# Patient Record
Sex: Male | Born: 1955 | Race: White | Hispanic: No | Smoking: Former smoker
Health system: Southern US, Community
[De-identification: ages and names within clinical notes are randomized; demographics above are authoritative.]

## PROBLEM LIST (undated history)

## (undated) ENCOUNTER — Emergency Department (HOSPITAL_COMMUNITY): Payer: Medicare PPO | Source: Home / Self Care

## (undated) DIAGNOSIS — I10 Essential (primary) hypertension: Secondary | ICD-10-CM

## (undated) HISTORY — PX: APPENDECTOMY: SHX54

## (undated) HISTORY — PX: OTHER SURGICAL HISTORY: SHX169

## (undated) HISTORY — PX: EYE SURGERY: SHX253

## (undated) HISTORY — PX: KNEE SURGERY: SHX244

---

## 2000-12-15 ENCOUNTER — Ambulatory Visit (HOSPITAL_COMMUNITY): Admission: RE | Admit: 2000-12-15 | Discharge: 2000-12-15 | Payer: Self-pay | Admitting: *Deleted

## 2001-02-19 ENCOUNTER — Ambulatory Visit (HOSPITAL_COMMUNITY): Admission: RE | Admit: 2001-02-19 | Discharge: 2001-02-19 | Payer: Self-pay | Admitting: *Deleted

## 2001-03-07 ENCOUNTER — Encounter: Payer: Self-pay | Admitting: Emergency Medicine

## 2001-03-07 ENCOUNTER — Emergency Department (HOSPITAL_COMMUNITY): Admission: EM | Admit: 2001-03-07 | Discharge: 2001-03-07 | Payer: Self-pay | Admitting: Emergency Medicine

## 2001-06-15 ENCOUNTER — Observation Stay (HOSPITAL_COMMUNITY): Admission: EM | Admit: 2001-06-15 | Discharge: 2001-06-16 | Payer: Self-pay | Admitting: *Deleted

## 2001-06-15 ENCOUNTER — Encounter: Payer: Self-pay | Admitting: *Deleted

## 2001-08-28 ENCOUNTER — Encounter: Payer: Self-pay | Admitting: *Deleted

## 2001-08-28 ENCOUNTER — Emergency Department (HOSPITAL_COMMUNITY): Admission: EM | Admit: 2001-08-28 | Discharge: 2001-08-28 | Payer: Self-pay | Admitting: *Deleted

## 2002-02-17 ENCOUNTER — Encounter: Payer: Self-pay | Admitting: Gastroenterology

## 2002-02-17 ENCOUNTER — Encounter: Admission: RE | Admit: 2002-02-17 | Discharge: 2002-02-17 | Payer: Self-pay | Admitting: Gastroenterology

## 2002-03-01 ENCOUNTER — Encounter: Payer: Self-pay | Admitting: Gastroenterology

## 2002-03-01 ENCOUNTER — Encounter: Admission: RE | Admit: 2002-03-01 | Discharge: 2002-03-01 | Payer: Self-pay | Admitting: Gastroenterology

## 2003-10-23 ENCOUNTER — Emergency Department (HOSPITAL_COMMUNITY): Admission: EM | Admit: 2003-10-23 | Discharge: 2003-10-24 | Payer: Self-pay | Admitting: *Deleted

## 2005-11-21 ENCOUNTER — Emergency Department (HOSPITAL_COMMUNITY): Admission: EM | Admit: 2005-11-21 | Discharge: 2005-11-21 | Payer: Self-pay | Admitting: Emergency Medicine

## 2005-11-28 ENCOUNTER — Emergency Department (HOSPITAL_COMMUNITY): Admission: EM | Admit: 2005-11-28 | Discharge: 2005-11-28 | Payer: Self-pay | Admitting: Emergency Medicine

## 2007-01-26 ENCOUNTER — Ambulatory Visit (HOSPITAL_COMMUNITY): Admission: RE | Admit: 2007-01-26 | Discharge: 2007-01-26 | Payer: Self-pay | Admitting: Family Medicine

## 2007-03-29 ENCOUNTER — Ambulatory Visit: Payer: Self-pay | Admitting: Orthopedic Surgery

## 2007-03-31 ENCOUNTER — Encounter: Payer: Self-pay | Admitting: Orthopedic Surgery

## 2007-04-20 ENCOUNTER — Emergency Department (HOSPITAL_COMMUNITY): Admission: EM | Admit: 2007-04-20 | Discharge: 2007-04-20 | Payer: Self-pay | Admitting: Emergency Medicine

## 2007-11-08 ENCOUNTER — Emergency Department (HOSPITAL_COMMUNITY): Admission: EM | Admit: 2007-11-08 | Discharge: 2007-11-08 | Payer: Self-pay | Admitting: Emergency Medicine

## 2008-04-03 ENCOUNTER — Emergency Department (HOSPITAL_COMMUNITY): Admission: EM | Admit: 2008-04-03 | Discharge: 2008-04-03 | Payer: Self-pay | Admitting: Emergency Medicine

## 2008-07-29 ENCOUNTER — Emergency Department (HOSPITAL_COMMUNITY): Admission: EM | Admit: 2008-07-29 | Discharge: 2008-07-29 | Payer: Self-pay | Admitting: Emergency Medicine

## 2008-08-15 ENCOUNTER — Encounter (INDEPENDENT_AMBULATORY_CARE_PROVIDER_SITE_OTHER): Payer: Self-pay | Admitting: General Surgery

## 2008-08-15 ENCOUNTER — Ambulatory Visit (HOSPITAL_COMMUNITY): Admission: AD | Admit: 2008-08-15 | Discharge: 2008-08-15 | Payer: Self-pay | Admitting: General Surgery

## 2008-10-02 ENCOUNTER — Ambulatory Visit (HOSPITAL_COMMUNITY): Admission: RE | Admit: 2008-10-02 | Discharge: 2008-10-02 | Payer: Self-pay | Admitting: Family Medicine

## 2009-07-10 ENCOUNTER — Ambulatory Visit (HOSPITAL_COMMUNITY): Admission: RE | Admit: 2009-07-10 | Discharge: 2009-07-10 | Payer: Self-pay | Admitting: Urology

## 2010-07-29 ENCOUNTER — Inpatient Hospital Stay (HOSPITAL_COMMUNITY)
Admission: RE | Admit: 2010-07-29 | Discharge: 2010-08-01 | Payer: Self-pay | Source: Home / Self Care | Attending: Orthopedic Surgery | Admitting: Orthopedic Surgery

## 2010-07-29 LAB — SURGICAL PCR SCREEN
MRSA, PCR: NEGATIVE
Staphylococcus aureus: NEGATIVE

## 2010-07-29 LAB — COMPREHENSIVE METABOLIC PANEL
ALT: 25 U/L (ref 0–53)
AST: 23 U/L (ref 0–37)
Albumin: 4.5 g/dL (ref 3.5–5.2)
Alkaline Phosphatase: 45 U/L (ref 39–117)
BUN: 15 mg/dL (ref 6–23)
CO2: 27 mEq/L (ref 19–32)
Calcium: 9.8 mg/dL (ref 8.4–10.5)
Chloride: 100 mEq/L (ref 96–112)
Creatinine, Ser: 0.77 mg/dL (ref 0.4–1.5)
GFR calc Af Amer: >60 mL/min (ref >60)
GFR calc non Af Amer: >60 mL/min (ref >60)
Glucose, Bld: 122 mg/dL — ABNORMAL HIGH (ref 70–99)
Potassium: 4 mEq/L (ref 3.5–5.1)
Sodium: 136 mEq/L (ref 135–145)
Total Bilirubin: 0.7 mg/dL (ref 0.3–1.2)
Total Protein: 7.4 g/dL (ref 6.0–8.3)

## 2010-07-29 LAB — URINALYSIS, ROUTINE W REFLEX MICROSCOPIC
Bilirubin Urine: NEGATIVE
Hgb urine dipstick: NEGATIVE
Ketones, ur: NEGATIVE mg/dL
Nitrite: NEGATIVE
Protein, ur: NEGATIVE mg/dL
Specific Gravity, Urine: 1.012 (ref 1.005–1.030)
Urine Glucose, Fasting: NEGATIVE mg/dL
Urobilinogen, UA: 0.2 mg/dL (ref 0.0–1.0)
pH: 6 (ref 5.0–8.0)

## 2010-07-29 LAB — CBC
HCT: 42 % (ref 39.0–52.0)
Hemoglobin: 14.4 g/dL (ref 13.0–17.0)
MCH: 31.4 pg (ref 26.0–34.0)
MCHC: 34.3 g/dL (ref 30.0–36.0)
MCV: 91.5 fL (ref 78.0–100.0)
Platelets: 273 10*3/uL (ref 150–400)
RBC: 4.59 MIL/uL (ref 4.22–5.81)
RDW: 12.6 % (ref 11.5–15.5)
WBC: 5.6 10*3/uL (ref 4.0–10.5)

## 2010-07-29 LAB — PROTIME-INR
INR: 0.99 (ref 0.00–1.49)
Prothrombin Time: 13.3 seconds (ref 11.6–15.2)

## 2010-07-29 LAB — APTT: aPTT: 29 seconds (ref 24–37)

## 2010-07-31 LAB — CBC
HCT: 31.3 % — ABNORMAL LOW (ref 39.0–52.0)
Hemoglobin: 10.4 g/dL — ABNORMAL LOW (ref 13.0–17.0)
MCH: 30.9 pg (ref 26.0–34.0)
MCHC: 33.2 g/dL (ref 30.0–36.0)
MCV: 92.9 fL (ref 78.0–100.0)
Platelets: 248 10*3/uL (ref 150–400)
RBC: 3.37 MIL/uL — ABNORMAL LOW (ref 4.22–5.81)
RDW: 12.8 % (ref 11.5–15.5)
WBC: 9 10*3/uL (ref 4.0–10.5)

## 2010-07-31 LAB — BASIC METABOLIC PANEL
BUN: 13 mg/dL (ref 6–23)
CO2: 28 mEq/L (ref 19–32)
Calcium: 8.5 mg/dL (ref 8.4–10.5)
Chloride: 102 mEq/L (ref 96–112)
Creatinine, Ser: 0.66 mg/dL (ref 0.4–1.5)
GFR calc Af Amer: >60 mL/min (ref >60)
GFR calc non Af Amer: >60 mL/min (ref >60)
Glucose, Bld: 127 mg/dL — ABNORMAL HIGH (ref 70–99)
Potassium: 4.2 mEq/L (ref 3.5–5.1)
Sodium: 135 mEq/L (ref 135–145)

## 2010-07-31 LAB — TYPE AND SCREEN
ABO/RH(D): A POS
Antibody Screen: NEGATIVE

## 2010-07-31 LAB — PROTIME-INR
INR: 1.05 (ref 0.00–1.49)
Prothrombin Time: 13.9 seconds (ref 11.6–15.2)

## 2010-07-31 LAB — ABO/RH: ABO/RH(D): A POS

## 2010-08-05 LAB — CBC
HCT: 29.5 % — ABNORMAL LOW (ref 39.0–52.0)
Hemoglobin: 9.9 g/dL — ABNORMAL LOW (ref 13.0–17.0)
MCH: 31 pg (ref 26.0–34.0)
MCH: 30.6 pg (ref 26.0–34.0)
MCHC: 33.6 g/dL (ref 30.0–36.0)
MCHC: 33.5 g/dL (ref 30.0–36.0)
MCV: 92.5 fL (ref 78.0–100.0)
Platelets: 235 10*3/uL (ref 150–400)
Platelets: 257 10*3/uL (ref 150–400)
RBC: 3.19 MIL/uL — ABNORMAL LOW (ref 4.22–5.81)
RBC: 3.04 MIL/uL — ABNORMAL LOW (ref 4.22–5.81)
RDW: 12.7 % (ref 11.5–15.5)
WBC: 8.8 10*3/uL (ref 4.0–10.5)

## 2010-08-05 LAB — BASIC METABOLIC PANEL
BUN: 11 mg/dL (ref 6–23)
CO2: 29 mEq/L (ref 19–32)
Calcium: 8.8 mg/dL (ref 8.4–10.5)
Chloride: 100 mEq/L (ref 96–112)
Creatinine, Ser: 0.8 mg/dL (ref 0.4–1.5)
GFR calc Af Amer: >60 mL/min (ref >60)
GFR calc non Af Amer: >60 mL/min (ref >60)
Glucose, Bld: 103 mg/dL — ABNORMAL HIGH (ref 70–99)
Potassium: 4.1 mEq/L (ref 3.5–5.1)
Sodium: 135 mEq/L (ref 135–145)

## 2010-08-05 LAB — PROTIME-INR
INR: 1.43 (ref 0.00–1.49)
Prothrombin Time: 17.6 seconds — ABNORMAL HIGH (ref 11.6–15.2)
Prothrombin Time: 18.7 seconds — ABNORMAL HIGH (ref 11.6–15.2)

## 2010-08-19 ENCOUNTER — Ambulatory Visit (HOSPITAL_COMMUNITY)
Admission: RE | Admit: 2010-08-19 | Discharge: 2010-08-19 | Disposition: A | Discharge status: Home / Self Care | Payer: Medicare PPO | Source: Ambulatory Visit | Attending: Orthopedic Surgery | Admitting: Orthopedic Surgery

## 2010-08-19 DIAGNOSIS — M25669 Stiffness of unspecified knee, not elsewhere classified: Secondary | ICD-10-CM | POA: Insufficient documentation

## 2010-08-19 DIAGNOSIS — R29898 Other symptoms and signs involving the musculoskeletal system: Secondary | ICD-10-CM | POA: Insufficient documentation

## 2010-08-19 DIAGNOSIS — R262 Difficulty in walking, not elsewhere classified: Secondary | ICD-10-CM | POA: Insufficient documentation

## 2010-08-19 DIAGNOSIS — M6281 Muscle weakness (generalized): Secondary | ICD-10-CM | POA: Insufficient documentation

## 2010-08-19 DIAGNOSIS — M25569 Pain in unspecified knee: Secondary | ICD-10-CM | POA: Insufficient documentation

## 2010-08-19 DIAGNOSIS — IMO0001 Reserved for inherently not codable concepts without codable children: Secondary | ICD-10-CM | POA: Insufficient documentation

## 2010-08-21 ENCOUNTER — Ambulatory Visit (HOSPITAL_COMMUNITY)
Admission: RE | Admit: 2010-08-21 | Discharge: 2010-08-21 | Disposition: A | Discharge status: Home / Self Care | Payer: Medicare PPO | Source: Ambulatory Visit

## 2010-08-23 ENCOUNTER — Ambulatory Visit (HOSPITAL_COMMUNITY)
Admission: RE | Admit: 2010-08-23 | Discharge: 2010-08-23 | Disposition: A | Discharge status: Home / Self Care | Payer: Medicare PPO | Source: Ambulatory Visit | Attending: Physical Therapy | Admitting: Physical Therapy

## 2010-08-26 ENCOUNTER — Ambulatory Visit (HOSPITAL_COMMUNITY)
Admission: RE | Admit: 2010-08-26 | Discharge: 2010-08-26 | Disposition: A | Discharge status: Home / Self Care | Payer: Medicare PPO | Source: Ambulatory Visit | Attending: Physical Therapy | Admitting: Physical Therapy

## 2010-08-28 ENCOUNTER — Ambulatory Visit (HOSPITAL_COMMUNITY)
Admission: RE | Admit: 2010-08-28 | Discharge: 2010-08-28 | Disposition: A | Discharge status: Home / Self Care | Payer: Medicare PPO | Source: Ambulatory Visit | Attending: Physical Therapy | Admitting: Physical Therapy

## 2010-08-28 NOTE — Discharge Summary (Signed)
Dwayne Anthony, BOLDS NO.:  [id number]  MEDICAL RECORD NO.:  [id number]          PATIENT TYPE:  INP  LOCATION:  1602                         FACILITY:  [facility]  PHYSICIAN:  Dwayne Anthony, M.D.    DATE OF BIRTH:  31-Jan-1956  DATE OF ADMISSION:  07/29/2010 DATE OF DISCHARGE:  08/01/2010                              DISCHARGE SUMMARY   ADMITTING DIAGNOSES: 1. Osteoarthritis of left knee. 2. Impaired vision. 3. Hypertension. 4. Hyperlipidemia. 5. Irritable bowel syndrome. 6. Osteoarthritis.  DISCHARGE DIAGNOSES: 1. Osteoarthritis of left knee, status post left total knee     replacement arthroplasty. 2. Postoperative acute blood loss anemia. 3. Impaired vision. 4. Hypertension. 5. Hyperlipidemia. 6. Irritable bowel syndrome. 7. Osteoarthritis.  PROCEDURE:  July 29, 2010, left total knee.  SURGEON:  Dwayne Anthony, M.D.  ASSISTANT:  Alexzandrew L. Perkins, P.A.C.  ANESTHESIA:  Spinal anesthesia.  TOURNIQUET TIME:  44 minutes.  CONSULTS:  None.  BRIEF HISTORY:  This is a 55 year old male with a post-patellectomy many years ago.  He got severe end-stage arthritis, medial and lateral compartments, bone on bone, failed nonoperative management and now presents for total knee arthroplasty.  LABORATORY DATA:  Preoperative CBC showed hemoglobin of 14.4, hematocrit of 42.0, white cell count 5.6, platelets 273,000.  Serial CBCs were followed with hemoglobin down to 10.4,  then 9.9, last noted hemoglobin and hematocrit 9.3 and 27.8.  PT/INR 13.3 and 0.99, with PT of 29. Serial protimes were followed per Coumadin protocol.  Last noted PT/INR 18.7 and 1.54.  Chem panel on admission, slightly elevated glucose at 122.  Remaining Chem panel all within normal limits.  Serial BMETs were followed for 48 hours.  Electrolytes remained within normal limits. Preoperative UA negative.  Blood group type A positive.  Nasal swabs were negative for Staphylococcus aureus  and negative for MRSA.  HOSPITAL COURSE:  The patient was admitted to [facility], taken to the OR, underwent above-stated procedure without complication. The patient tolerated the procedure well., later transferred to recovery room orthopedic floor, started on p.o. and IV analgesic for pain control following surgery, given 24 hours postoperative IV antibiotics.  Started on Coumadin for DVT prophylaxis.  Did actually pretty well on the morning of surgery, although did have a large postoperative positive volume, so we used Lasix to diurese extra fluid off.  Started getting up with therapy.  By day #2, he was doing well, diuresed a lot of the fluid and his output was good.  We discontinued his IV fluids.  Hemoglobin was 9.9.  He was asymptomatic with this.  Dressing changed, incision looked good.  Continued to progress well with physical therapy and by day #3 was meeting his goals, tolerating his medications, and discharged home.  DISCHARGE/PLAN:  The patient is discharged home on August 01, 2010.  DISCHARGE DIAGNOSES:  Please see above.  DISCHARGE MEDICATIONS: 1. Oxycodone. 2. Robaxin. 3. Nu-Iron. 4. Coumadin. 5. Continue enalapril. 6. Continue hydrochlorothiazide. 7. Continue Lexapro. 8. Continue Lipitor.  DIET:  Heart-healthy diet.  ACTIVITY:  He is weightbearing as tolerated per total knee protocol.  PT home health nursing.  Follow  up in 2 weeks.  DISPOSITION:  Home.  CONDITION ON DISCHARGE:  Improved.     Alexzandrew L. Julien Girt, P.A.C.   ______________________________ D[facility]ne Anthony, M.D.    ALP/MEDQ  D:  08/15/2010  T:  08/15/2010  Job:  [id number]  cc:   Kirk Ruths, M.D. Fax: [phone]  Electronically Signed by Patrica Duel P.A.C. on 08/15/2010 11:45:53 AM Electronically Signed by Dwayne Anthony M.D. on 08/28/2010 02:51:47 PM

## 2010-08-30 ENCOUNTER — Ambulatory Visit (HOSPITAL_COMMUNITY)
Admission: RE | Admit: 2010-08-30 | Discharge: 2010-08-30 | Disposition: A | Discharge status: Home / Self Care | Payer: Medicare PPO | Source: Ambulatory Visit | Attending: *Deleted | Admitting: *Deleted

## 2010-09-03 ENCOUNTER — Ambulatory Visit (HOSPITAL_COMMUNITY)
Admission: RE | Admit: 2010-09-03 | Discharge: 2010-09-03 | Disposition: A | Discharge status: Home / Self Care | Payer: Medicare PPO | Source: Ambulatory Visit | Attending: *Deleted | Admitting: *Deleted

## 2010-09-04 ENCOUNTER — Ambulatory Visit (HOSPITAL_COMMUNITY)
Admission: RE | Admit: 2010-09-04 | Discharge: 2010-09-04 | Disposition: A | Discharge status: Home / Self Care | Payer: Medicare PPO | Source: Ambulatory Visit | Attending: *Deleted | Admitting: *Deleted

## 2010-09-05 ENCOUNTER — Ambulatory Visit (HOSPITAL_COMMUNITY)
Admission: RE | Admit: 2010-09-05 | Discharge: 2010-09-05 | Disposition: A | Discharge status: Home / Self Care | Payer: Medicare PPO | Source: Ambulatory Visit | Attending: Orthopedic Surgery | Admitting: Orthopedic Surgery

## 2010-09-10 ENCOUNTER — Ambulatory Visit (HOSPITAL_COMMUNITY)
Admission: RE | Admit: 2010-09-10 | Discharge: 2010-09-10 | Disposition: A | Discharge status: Home / Self Care | Payer: Medicare PPO | Source: Ambulatory Visit | Attending: *Deleted | Admitting: *Deleted

## 2010-09-11 ENCOUNTER — Ambulatory Visit (HOSPITAL_COMMUNITY): Payer: Medicare PPO

## 2010-09-13 ENCOUNTER — Ambulatory Visit (HOSPITAL_COMMUNITY)
Admission: RE | Admit: 2010-09-13 | Discharge: 2010-09-13 | Disposition: A | Discharge status: Home / Self Care | Payer: Medicare PPO | Source: Ambulatory Visit | Attending: Orthopedic Surgery | Admitting: Orthopedic Surgery

## 2010-09-13 DIAGNOSIS — M6281 Muscle weakness (generalized): Secondary | ICD-10-CM | POA: Insufficient documentation

## 2010-09-13 DIAGNOSIS — R29898 Other symptoms and signs involving the musculoskeletal system: Secondary | ICD-10-CM | POA: Insufficient documentation

## 2010-09-13 DIAGNOSIS — R262 Difficulty in walking, not elsewhere classified: Secondary | ICD-10-CM | POA: Insufficient documentation

## 2010-09-13 DIAGNOSIS — M25669 Stiffness of unspecified knee, not elsewhere classified: Secondary | ICD-10-CM | POA: Insufficient documentation

## 2010-09-13 DIAGNOSIS — IMO0001 Reserved for inherently not codable concepts without codable children: Secondary | ICD-10-CM | POA: Insufficient documentation

## 2010-09-13 DIAGNOSIS — M25569 Pain in unspecified knee: Secondary | ICD-10-CM | POA: Insufficient documentation

## 2010-09-16 ENCOUNTER — Ambulatory Visit (HOSPITAL_COMMUNITY)
Admission: RE | Admit: 2010-09-16 | Discharge: 2010-09-16 | Disposition: A | Discharge status: Home / Self Care | Payer: Medicare PPO | Source: Ambulatory Visit | Attending: *Deleted | Admitting: *Deleted

## 2010-09-19 ENCOUNTER — Ambulatory Visit (HOSPITAL_COMMUNITY)
Admission: RE | Admit: 2010-09-19 | Discharge: 2010-09-19 | Disposition: A | Discharge status: Home / Self Care | Payer: Medicare PPO | Source: Ambulatory Visit | Attending: Orthopedic Surgery | Admitting: Orthopedic Surgery

## 2010-10-15 ENCOUNTER — Ambulatory Visit (HOSPITAL_COMMUNITY): Payer: Medicare PPO

## 2010-10-21 ENCOUNTER — Ambulatory Visit (HOSPITAL_COMMUNITY)
Admission: RE | Admit: 2010-10-21 | Discharge: 2010-10-21 | Disposition: A | Discharge status: Home / Self Care | Payer: Medicare PPO | Source: Ambulatory Visit | Attending: Orthopedic Surgery | Admitting: Orthopedic Surgery

## 2010-10-21 DIAGNOSIS — R262 Difficulty in walking, not elsewhere classified: Secondary | ICD-10-CM | POA: Insufficient documentation

## 2010-10-21 DIAGNOSIS — M25669 Stiffness of unspecified knee, not elsewhere classified: Secondary | ICD-10-CM | POA: Insufficient documentation

## 2010-10-21 DIAGNOSIS — M25569 Pain in unspecified knee: Secondary | ICD-10-CM | POA: Insufficient documentation

## 2010-10-21 DIAGNOSIS — M6281 Muscle weakness (generalized): Secondary | ICD-10-CM | POA: Insufficient documentation

## 2010-10-21 DIAGNOSIS — IMO0001 Reserved for inherently not codable concepts without codable children: Secondary | ICD-10-CM | POA: Insufficient documentation

## 2010-10-21 DIAGNOSIS — R29898 Other symptoms and signs involving the musculoskeletal system: Secondary | ICD-10-CM | POA: Insufficient documentation

## 2010-10-23 ENCOUNTER — Ambulatory Visit (HOSPITAL_COMMUNITY)
Admission: RE | Admit: 2010-10-23 | Discharge: 2010-10-23 | Disposition: A | Discharge status: Home / Self Care | Payer: Medicare PPO | Source: Ambulatory Visit | Attending: *Deleted | Admitting: *Deleted

## 2010-10-28 LAB — URINALYSIS, ROUTINE W REFLEX MICROSCOPIC
Ketones, ur: NEGATIVE mg/dL
Nitrite: NEGATIVE
Protein, ur: NEGATIVE mg/dL
Urobilinogen, UA: 1 mg/dL (ref 0.0–1.0)

## 2010-10-28 LAB — CBC
MCV: 89.3 fL (ref 78.0–100.0)
Platelets: 273 10*3/uL (ref 150–400)
WBC: 4.8 10*3/uL (ref 4.0–10.5)

## 2010-10-28 LAB — POCT CARDIAC MARKERS: Myoglobin, poc: 42.5 ng/mL (ref 12–200)

## 2010-10-28 LAB — DIFFERENTIAL
Basophils Absolute: 0.1 10*3/uL (ref 0.0–0.1)
Eosinophils Absolute: 0.1 10*3/uL (ref 0.0–0.7)
Lymphocytes Relative: 33 % (ref 12–46)
Lymphs Abs: 1.6 10*3/uL (ref 0.7–4.0)
Neutrophils Relative %: 51 % (ref 43–77)

## 2010-10-28 LAB — BASIC METABOLIC PANEL
BUN: 12 mg/dL (ref 6–23)
Creatinine, Ser: 0.84 mg/dL (ref 0.4–1.5)
GFR calc non Af Amer: >60 mL/min (ref >60)

## 2010-10-30 ENCOUNTER — Ambulatory Visit (HOSPITAL_COMMUNITY)
Admission: RE | Admit: 2010-10-30 | Discharge: 2010-10-30 | Disposition: A | Discharge status: Home / Self Care | Payer: Medicare PPO | Source: Ambulatory Visit | Attending: Family Medicine | Admitting: Family Medicine

## 2010-11-26 NOTE — H&P (Signed)
NAME:  Dwayne Anthony, Dwayne Anthony               ACCOUNT NO.:  [id number]   MEDICAL RECORD NO.:  [id number]          PATIENT TYPE:  AMB   LOCATION:  DAY                           FACILITY:  [facility]   PHYSICIAN:  Dalia Heading, M.D.  DATE OF BIRTH:  07-15-1955   DATE OF ADMISSION:  DATE OF DISCHARGE:  LH                              HISTORY & PHYSICAL   CHIEF COMPLAINT:  Stool incontinence, need for screening colonoscopy.   HISTORY OF PRESENT ILLNESS:  The patient is a 55 year old white male who  is referred for endoscopic evaluation.  He needs colonoscopy for  screening purposes and for rectal incontinence.  He has had incontinence  for many years.  No abdominal pain, weight loss, nausea, vomiting,  diarrhea, constipation, melena, hematochezia have been noted.  There is  no family history of colon carcinoma.  Past medical history includes  osteoarthritis of the knees, hypertension, depression.   PAST SURGICAL HISTORY:  Knee surgery.   CURRENT MEDICATIONS:  Oxycodone, Lexapro, a blood pressure pill.   ALLERGIES:  NO KNOWN DRUG ALLERGIES.   REVIEW OF SYSTEMS:  The patient drinks a 6 pack of beer a day.  Denies  any recent MI, chest pain, CVA, or diabetes mellitus.   PHYSICAL EXAMINATION:  GENERAL:  The patient is a well-developed, well-  nourished white male in no acute distress.  LUNGS:  Clear to auscultation with equal breath sounds bilaterally.  HEART EXAMINATION:  Regular rate and rhythm without S3, S4, murmurs.  ABDOMEN:  Soft, nontender, nondistended.  No hepatosplenomegaly or  masses are noted.  He does have an umbilical hernia.  RECTAL  EXAMINATION:  Deferred to the procedure.   IMPRESSION:  Need for screening colonoscopy, incontinence.   PLAN:  The patient is scheduled for a colonoscopy on August 15, 2008.  The risks and benefits of the procedure including bleeding and  perforation were fully explained to the patient, gave inform consent.      Dalia Heading, M.D.  Electronically Signed    MAJ/MEDQ  D:  08/08/2008  T:  08/09/2008  Job:  [id number]

## 2010-11-29 NOTE — Discharge Summary (Signed)
[facility]  Patient:    Dwayne Anthony, Dwayne Anthony Visit Number: [id number] MRN: [id number]          Service Type: OBS Location: 3A A306 01 Attending Physician:  Rosalyn Charters Dictated by:   Karleen Hampshire, M.D. Admit Date:  06/15/2001 Disc. Date: 06/16/01                             Discharge Summary  DISCHARGE DIAGNOSES: 1. Acute compression fracture of L1. 2. History of hypertension, controlled.  HOSPITAL COURSE:  This is a 55 year old, white male who was moving furniture when he felt a certain pop and incapacitation due to pain.  In the emergency room, the patient was found to have a compression fracture of L1 and was so uncomfortable, he was admitted for pain control.  The patient was given Tylox p.o. as well as IV Demerol p.r.n. with some relief of his pain.  The patient, while still hurting upon movement, was able to comfortable.  DISCHARGE MEDICATIONS: 1. Tylox p.o. 2. Valium.  SPECIAL INSTRUCTIONS:  Arrangements for back support and physical therapy were made.  FOLLOWUP:  We will follow the patient in the office in approximately one week.  CONDITION ON DISCHARGE:  Stable. Dictated by:   Karleen Hampshire, M.D. Attending Physician:  Rosalyn Charters DD:  06/16/01 TD:  06/16/01 Job: [id number]

## 2012-05-10 ENCOUNTER — Ambulatory Visit: Payer: Medicare PPO | Admitting: Family Medicine

## 2012-05-14 ENCOUNTER — Ambulatory Visit: Payer: Medicare PPO | Admitting: Family Medicine

## 2012-11-25 ENCOUNTER — Other Ambulatory Visit: Payer: Self-pay

## 2012-11-25 ENCOUNTER — Emergency Department (HOSPITAL_COMMUNITY): Payer: Medicare PPO

## 2012-11-25 ENCOUNTER — Emergency Department (HOSPITAL_COMMUNITY)
Admission: EM | Admit: 2012-11-25 | Discharge: 2012-11-25 | Disposition: A | Discharge status: Home / Self Care | Payer: Medicare PPO | Attending: Emergency Medicine | Admitting: Emergency Medicine

## 2012-11-25 ENCOUNTER — Encounter (HOSPITAL_COMMUNITY): Payer: Self-pay | Admitting: Emergency Medicine

## 2012-11-25 DIAGNOSIS — R112 Nausea with vomiting, unspecified: Secondary | ICD-10-CM | POA: Insufficient documentation

## 2012-11-25 DIAGNOSIS — B349 Viral infection, unspecified: Secondary | ICD-10-CM

## 2012-11-25 DIAGNOSIS — R209 Unspecified disturbances of skin sensation: Secondary | ICD-10-CM | POA: Insufficient documentation

## 2012-11-25 DIAGNOSIS — Z87891 Personal history of nicotine dependence: Secondary | ICD-10-CM | POA: Insufficient documentation

## 2012-11-25 DIAGNOSIS — I10 Essential (primary) hypertension: Secondary | ICD-10-CM | POA: Insufficient documentation

## 2012-11-25 DIAGNOSIS — R6883 Chills (without fever): Secondary | ICD-10-CM | POA: Insufficient documentation

## 2012-11-25 DIAGNOSIS — B9789 Other viral agents as the cause of diseases classified elsewhere: Secondary | ICD-10-CM | POA: Insufficient documentation

## 2012-11-25 DIAGNOSIS — Z79899 Other long term (current) drug therapy: Secondary | ICD-10-CM | POA: Insufficient documentation

## 2012-11-25 DIAGNOSIS — Z7982 Long term (current) use of aspirin: Secondary | ICD-10-CM | POA: Insufficient documentation

## 2012-11-25 HISTORY — DX: Essential (primary) hypertension: I10

## 2012-11-25 LAB — LIPASE, BLOOD: Lipase: 24 U/L (ref 11–59)

## 2012-11-25 LAB — CBC WITH DIFFERENTIAL/PLATELET
Basophils Absolute: 0 10*3/uL (ref 0.0–0.1)
Basophils Relative: 0 % (ref 0–1)
Eosinophils Absolute: 0.1 10*3/uL (ref 0.0–0.7)
Eosinophils Relative: 1 % (ref 0–5)
Lymphocytes Relative: 23 % (ref 12–46)
MCHC: 35.1 g/dL (ref 30.0–36.0)
MCV: 88.5 fL (ref 78.0–100.0)
Platelets: 243 10*3/uL (ref 150–400)
RDW: 12.6 % (ref 11.5–15.5)
WBC: 7 10*3/uL (ref 4.0–10.5)

## 2012-11-25 LAB — BASIC METABOLIC PANEL
BUN: 15 mg/dL (ref 6–23)
Calcium: 10 mg/dL (ref 8.4–10.5)
Chloride: 98 mEq/L (ref 96–112)
Creatinine, Ser: 0.84 mg/dL (ref 0.50–1.35)
GFR calc Af Amer: >90 mL/min (ref >90)
GFR calc non Af Amer: >90 mL/min (ref >90)

## 2012-11-25 LAB — HEPATIC FUNCTION PANEL
Bilirubin, Direct: <0.1 mg/dL (ref 0.0–0.3)
Total Protein: 7.2 g/dL (ref 6.0–8.3)

## 2012-11-25 LAB — D-DIMER, QUANTITATIVE: D-Dimer, Quant: 0.32 ug/mL-FEU (ref 0.00–0.48)

## 2012-11-25 LAB — TROPONIN I: Troponin I: <0.3 ng/mL (ref <0.30)

## 2012-11-25 MED ORDER — SODIUM CHLORIDE 0.9 % IV SOLN
INTRAVENOUS | Status: DC
  Administered 2012-11-25: 16:00:00 via INTRAVENOUS

## 2012-11-25 MED ORDER — ONDANSETRON HCL 4 MG/2ML IJ SOLN
4.0000 mg | Freq: Once | INTRAMUSCULAR | Status: AC
  Administered 2012-11-25: 4 mg via INTRAVENOUS
  Filled 2012-11-25: qty 2

## 2012-11-25 MED ORDER — ONDANSETRON 4 MG PO TBDP: 4.0000 mg | ORAL_TABLET | Freq: Three times a day (TID) | ORAL | Status: DC | PRN

## 2012-11-25 MED ORDER — SODIUM CHLORIDE 0.9 % IV BOLUS (SEPSIS)
1000.0000 mL | Freq: Once | INTRAVENOUS | Status: AC
  Administered 2012-11-25: 1000 mL via INTRAVENOUS

## 2012-11-25 MED ORDER — PROMETHAZINE HCL 25 MG PO TABS: 25.0000 mg | ORAL_TABLET | Freq: Four times a day (QID) | ORAL | Status: AC | PRN

## 2012-11-25 NOTE — ED Notes (Signed)
Pt with sudden left arm/hand numbness that started around 1030 this morning

## 2012-11-25 NOTE — ED Provider Notes (Signed)
History     CSN: [id number]  Arrival date & time 11/25/12  1211   First MD Initiated Contact with Patient 11/25/12 1317      Chief Complaint  Patient presents with  . Excessive Sweating  . Nausea    (Consider location/radiation/quality/duration/timing/severity/associated sxs/prior treatment) The history is provided by the patient.   57 year old male feeling fine until 10:30 this morning he had just left his primary care doctor's office visit for routine check he was feeling fine then at 10:30 this morning he started to get acute onset of nausea and vomiting has vomited 15-20 times since then. Had bilateral arm numbness left more than right. The numbness was predominantly in the fingers. No diarrhea associated with the illness no blood in the vomit. No bowel pain no chest pain no shortness of breath. No fever but did have chills. Patient's primary care Dr. is Dr. Delbert Harness.  Past Medical History  Diagnosis Date  . Hypertension     Past Surgical History  Procedure Laterality Date  . Knee surgery Left   . Eye surgery    . Appendectomy      History reviewed. No pertinent family history.  History  Substance Use Topics  . Smoking status: Former Games developer  . Smokeless tobacco: Never Used  . Alcohol Use: Yes      Review of Systems  Constitutional: Positive for chills. Negative for fever.  HENT: Negative for congestion.   Eyes: Negative for visual disturbance.  Cardiovascular: Negative for chest pain and leg swelling.  Gastrointestinal: Positive for nausea and vomiting. Negative for abdominal pain and diarrhea.  Genitourinary: Negative for dysuria and hematuria.  Musculoskeletal: Negative for myalgias.  Skin: Negative for rash.  Neurological: Positive for numbness. Negative for headaches.  Hematological: Does not bruise/bleed easily.  Psychiatric/Behavioral: Negative for confusion.    Allergies  Review of patient's allergies indicates no known allergies.  Home  Medications   Current Outpatient Rx  Name  Route  Sig  Dispense  Refill  . amLODipine (NORVASC) 10 MG tablet   Oral   Take 10 mg by mouth daily.         Marland Kitchen aspirin EC 81 MG tablet   Oral   Take 81 mg by mouth daily.         Marland Kitchen atorvastatin (LIPITOR) 40 MG tablet   Oral   Take 40 mg by mouth daily.         . enalapril (VASOTEC) 20 MG tablet   Oral   Take 20 mg by mouth daily.         Marland Kitchen escitalopram (LEXAPRO) 10 MG tablet   Oral   Take 10 mg by mouth daily.         . fish oil-omega-3 fatty acids 1000 MG capsule   Oral   Take 1 g by mouth daily.         Marland Kitchen oxyCODONE (OXYCONTIN) 10 MG 12 hr tablet   Oral   Take 10 mg by mouth every 12 (twelve) hours as needed for pain.         Marland Kitchen ondansetron (ZOFRAN ODT) 4 MG disintegrating tablet   Oral   Take 1 tablet (4 mg total) by mouth every 8 (eight) hours as needed for nausea.   10 tablet   0   . promethazine (PHENERGAN) 25 MG tablet   Oral   Take 1 tablet (25 mg total) by mouth every 6 (six) hours as needed for nausea.   12 tablet  0     BP 112/64  Pulse 63  Temp(Src) 97.7 F (36.5 C) (Oral)  Resp 12  Ht 6\' 1"  (1.854 m)  Wt 220 lb (99.791 kg)  BMI 29.03 kg/m2  SpO2 98%  Physical Exam  Nursing note and vitals reviewed. Constitutional: He is oriented to person, place, and time. He appears well-developed and well-nourished.  HENT:  Head: Normocephalic and atraumatic.  Mouth/Throat: Oropharynx is clear and moist.  Eyes: Conjunctivae and EOM are normal. Pupils are equal, round, and reactive to light.  Neck: Normal range of motion. Neck supple.  Cardiovascular: Normal rate, regular rhythm, normal heart sounds and intact distal pulses.   No murmur heard. Pulmonary/Chest: Effort normal and breath sounds normal. No respiratory distress.  Abdominal: Soft. Bowel sounds are normal. There is no tenderness.  Musculoskeletal: Normal range of motion. He exhibits no edema and no tenderness.  Neurological: He is  alert and oriented to person, place, and time. No cranial nerve deficit. He exhibits normal muscle tone. Coordination normal.  Skin: Skin is warm. No rash noted.    ED Course  Procedures (including critical care time)  Labs Reviewed  BASIC METABOLIC PANEL - Abnormal; Notable for the following:    Glucose, Bld 133 (*)    All other components within normal limits  CBC WITH DIFFERENTIAL  TROPONIN I  HEPATIC FUNCTION PANEL  LIPASE, BLOOD  D-DIMER, QUANTITATIVE   Ct Head Wo Contrast  11/25/2012   *RADIOLOGY REPORT*  Clinical Data: Excessive sweating and nausea.  CT HEAD WITHOUT CONTRAST  Technique:  Contiguous axial images were obtained from the base of the skull through the vertex without contrast.  Comparison:  CT head without contrast 07/29/2008.  Findings: Scattered subcortical white matter hypoattenuation is stable.  No acute cortical infarct, hemorrhage, or mass lesion is present.  The ventricles are of normal size.  No significant extra- axial fluid collection is present.  A single right ethmoid air cell is opacified.  The paranasal sinuses are otherwise clear.  There is some fluid in the left mastoid air cells.  The osseous skull is intact.  IMPRESSION:  1.  No acute intracranial abnormality or significant interval change. 2.  Stable subcortical white matter disease. 3.  Fluid in the left mastoid air cells.  No obstructing nasopharyngeal lesion is evident.   Original Report Authenticated By: Marin Roberts, M.D.   Dg Chest Portable 1 View  11/25/2012   *RADIOLOGY REPORT*  Clinical Data: Vomiting and sweating  PORTABLE CHEST - 1 VIEW  Comparison: 10/02/2008  Findings: Mild cardiac enlargement without heart failure.  Negative for pneumonia or effusion.  Negative for mass lesion.  IMPRESSION: No acute abnormality.   Original Report Authenticated By: Janeece Riggers, M.D.    Date: 11/25/2012  Rate: 65  Rhythm: normal sinus rhythm and sinus arrhythmia  QRS Axis: normal  Intervals: normal   ST/T Wave abnormalities: normal  Conduction Disutrbances:none  Narrative Interpretation:   Old EKG Reviewed: none available  Results for orders placed during the hospital encounter of 11/25/12  CBC WITH DIFFERENTIAL      Result Value Range   WBC 7.0  4.0 - 10.5 K/uL   RBC 4.51  4.22 - 5.81 MIL/uL   Hemoglobin 14.0  13.0 - 17.0 g/dL   HCT 78.2  95.6 - 21.3 %   MCV 88.5  78.0 - 100.0 fL   MCH 31.0  26.0 - 34.0 pg   MCHC 35.1  30.0 - 36.0 g/dL   RDW 08.6  57.8 -  15.5 %   Platelets 243  150 - 400 K/uL   Neutrophils Relative % 70  43 - 77 %   Neutro Abs 4.8  1.7 - 7.7 K/uL   Lymphocytes Relative 23  12 - 46 %   Lymphs Abs 1.6  0.7 - 4.0 K/uL   Monocytes Relative 6  3 - 12 %   Monocytes Absolute 0.4  0.1 - 1.0 K/uL   Eosinophils Relative 1  0 - 5 %   Eosinophils Absolute 0.1  0.0 - 0.7 K/uL   Basophils Relative 0  0 - 1 %   Basophils Absolute 0.0  0.0 - 0.1 K/uL  TROPONIN I      Result Value Range   Troponin I <0.30  <0.30 ng/mL  BASIC METABOLIC PANEL      Result Value Range   Sodium 136  135 - 145 mEq/L   Potassium 3.9  3.5 - 5.1 mEq/L   Chloride 98  96 - 112 mEq/L   CO2 24  19 - 32 mEq/L   Glucose, Bld 133 (*) 70 - 99 mg/dL   BUN 15  6 - 23 mg/dL   Creatinine, Ser 0.86  0.50 - 1.35 mg/dL   Calcium 57.8  8.4 - 46.9 mg/dL   GFR calc non Af Amer >90  >90 mL/min   GFR calc Af Amer >90  >90 mL/min  HEPATIC FUNCTION PANEL      Result Value Range   Total Protein 7.2  6.0 - 8.3 g/dL   Albumin 4.5  3.5 - 5.2 g/dL   AST 24  0 - 37 U/L   ALT 26  0 - 53 U/L   Alkaline Phosphatase 64  39 - 117 U/L   Total Bilirubin 0.4  0.3 - 1.2 mg/dL   Bilirubin, Direct <6.2  0.0 - 0.3 mg/dL   Indirect Bilirubin NOT CALCULATED  0.3 - 0.9 mg/dL  LIPASE, BLOOD      Result Value Range   Lipase 24  11 - 59 U/L  D-DIMER, QUANTITATIVE      Result Value Range   D-Dimer, Quant 0.32  0.00 - 0.48 ug/mL-FEU      1. Nausea & vomiting   2. Viral illness       MDM   Patient with extensive  workup for symptoms but the main complaint was persistent vomiting suspect a viral gastritis as the cause. No evidence of pulmonary embolus due to the d-dimer being normal. Patient does not have abdominal pain liver function test are normal lipase is not consistent with pancreatitis. Patient's troponin was negative no acute cardiac changes on EKG. No leukocytosis no anemia. Head CT was negative do not think the numbness is related to see date most likely related to acute vomiting illness. Patient will be discharged home we'll followup if not improved in 24 hours. Patient received 1 L of normal saline fluid hydration here. Patient's nausea and vomiting improves some with Zofran IV. Chest x-ray negative for pneumonia pulmonary edema or pneumothorax.       Shelda Jakes, MD 11/25/12 [phone]

## 2012-11-25 NOTE — ED Notes (Signed)
Pt states he had a doctor's appointment this AM, and reports nausea, vomiting, numbness in his left hand, and excessive sweating.

## 2013-03-14 ENCOUNTER — Encounter (HOSPITAL_COMMUNITY): Payer: Self-pay | Admitting: *Deleted

## 2013-03-14 ENCOUNTER — Emergency Department (HOSPITAL_COMMUNITY): Payer: Medicare PPO

## 2013-03-14 ENCOUNTER — Emergency Department (HOSPITAL_COMMUNITY)
Admission: EM | Admit: 2013-03-14 | Discharge: 2013-03-14 | Disposition: A | Discharge status: Home / Self Care | Payer: Medicare PPO | Attending: Emergency Medicine | Admitting: Emergency Medicine

## 2013-03-14 DIAGNOSIS — Y929 Unspecified place or not applicable: Secondary | ICD-10-CM | POA: Insufficient documentation

## 2013-03-14 DIAGNOSIS — I10 Essential (primary) hypertension: Secondary | ICD-10-CM | POA: Insufficient documentation

## 2013-03-14 DIAGNOSIS — IMO0002 Reserved for concepts with insufficient information to code with codable children: Secondary | ICD-10-CM | POA: Insufficient documentation

## 2013-03-14 DIAGNOSIS — Z79899 Other long term (current) drug therapy: Secondary | ICD-10-CM | POA: Insufficient documentation

## 2013-03-14 DIAGNOSIS — Z7982 Long term (current) use of aspirin: Secondary | ICD-10-CM | POA: Insufficient documentation

## 2013-03-14 DIAGNOSIS — S62309A Unspecified fracture of unspecified metacarpal bone, initial encounter for closed fracture: Secondary | ICD-10-CM

## 2013-03-14 DIAGNOSIS — Y939 Activity, unspecified: Secondary | ICD-10-CM | POA: Insufficient documentation

## 2013-03-14 MED ORDER — OXYCODONE-ACETAMINOPHEN 5-325 MG PO TABS: 1.0000 | ORAL_TABLET | Freq: Once | ORAL | Status: DC

## 2013-03-14 NOTE — ED Notes (Signed)
Pain rt hand since yesterday, No known injury

## 2013-03-14 NOTE — ED Provider Notes (Signed)
CSN: [id number]     Arrival date & time 03/14/13  1349 History   First MD Initiated Contact with Patient 03/14/13 1417     Chief Complaint  Patient presents with  . Hand Pain   (Consider location/radiation/quality/duration/timing/severity/associated sxs/prior Treatment) Patient is a 57 y.o. male presenting with hand injury. The history is provided by the patient.  Hand Injury Location:  Hand Upper extremity injury: unknown, belives he struck his hand on the headboard of his bed.   Hand location:  R hand Pain details:    Quality:  Aching and throbbing   Radiates to:  Does not radiate   Severity:  Moderate   Onset quality:  Sudden   Timing:  Constant   Progression:  Unchanged Chronicity:  New Handedness:  Right-handed Dislocation: no   Foreign body present:  No foreign bodies Prior injury to area:  No Relieved by:  Nothing Worsened by:  Movement Ineffective treatments:  Ice Associated symptoms: decreased range of motion and swelling   Associated symptoms: no fever, no numbness, no stiffness and no tingling   Risk factors: no frequent fractures and no recent illness     Past Medical History  Diagnosis Date  . Hypertension    Past Surgical History  Procedure Laterality Date  . Knee surgery Left   . Eye surgery    . Appendectomy    . Chest surgery to repair laceration     History reviewed. No pertinent family history. History  Substance Use Topics  . Smoking status: Former Games developer  . Smokeless tobacco: Never Used  . Alcohol Use: Yes    Review of Systems  Constitutional: Negative for fever and chills.  Genitourinary: Negative for dysuria and difficulty urinating.  Musculoskeletal: Positive for joint swelling and arthralgias. Negative for stiffness.  Skin: Negative for color change and wound.  All other systems reviewed and are negative.    Allergies  Review of patient's allergies indicates no known allergies.  Home Medications   Current Outpatient Rx  Name   Route  Sig  Dispense  Refill  . amLODipine (NORVASC) 10 MG tablet   Oral   Take 10 mg by mouth daily.         Marland Kitchen aspirin EC 81 MG tablet   Oral   Take 81 mg by mouth daily.         Marland Kitchen atorvastatin (LIPITOR) 40 MG tablet   Oral   Take 40 mg by mouth daily.         . enalapril (VASOTEC) 20 MG tablet   Oral   Take 20 mg by mouth daily.         Marland Kitchen escitalopram (LEXAPRO) 10 MG tablet   Oral   Take 10 mg by mouth daily.         . fish oil-omega-3 fatty acids 1000 MG capsule   Oral   Take 1 g by mouth daily.         . ondansetron (ZOFRAN ODT) 4 MG disintegrating tablet   Oral   Take 1 tablet (4 mg total) by mouth every 8 (eight) hours as needed for nausea.   10 tablet   0   . oxyCODONE (OXYCONTIN) 10 MG 12 hr tablet   Oral   Take 10 mg by mouth every 12 (twelve) hours as needed for pain.         . promethazine (PHENERGAN) 25 MG tablet   Oral   Take 1 tablet (25 mg total) by mouth every 6 (  six) hours as needed for nausea.   12 tablet   0    BP 113/78  Pulse 84  Temp(Src) 97.3 F (36.3 C) (Oral)  Resp 18  Ht 6\' 1"  (1.854 m)  Wt 220 lb (99.791 kg)  BMI 29.03 kg/m2  SpO2 97% Physical Exam  Nursing note and vitals reviewed. Constitutional: He is oriented to person, place, and time. He appears well-developed and well-nourished. No distress.  HENT:  Head: Normocephalic and atraumatic.  Cardiovascular: Normal rate, regular rhythm and normal heart sounds.   Pulmonary/Chest: Effort normal and breath sounds normal.  Musculoskeletal: He exhibits edema and tenderness.       Right hand: He exhibits decreased range of motion, tenderness and swelling. He exhibits normal capillary refill and no deformity. Normal sensation noted. Normal strength noted.       Hands: ttp of the dorsal right hand.  Moderate STS.  Radial pulse is brisk, distal sensation intact.  CR< 2 sec.  No bruising or bony deformity.  Pain reproduced with attempted movement of the fourth finger. No  proximal tenderness or edema  Neurological: He is alert and oriented to person, place, and time. He exhibits normal muscle tone. Coordination normal.  Skin: Skin is warm and dry.    ED Course  Procedures (including critical care time) Labs Review Labs Reviewed - No data to display Imaging Review Dg Hand Complete Right  03/14/2013   CLINICAL DATA:  Injury, pain.  EXAM: RIGHT HAND - COMPLETE 3+ VIEW  COMPARISON:  None.  FINDINGS: There is an oblique fracture through the right 4th metacarpal. Minimal displacement. No additional acute bony abnormality. Degenerative changes in the IP joints and 1st carpometacarpal joint.  IMPRESSION: Oblique right 4th metacarpal fracture.   Electronically Signed   By: Charlett Nose   On: 03/14/2013 14:52    MDM    X-ray findings discussed, pt continues to deny known injury or altercation.  No open wounds or bruising to the hand.  Ulnar gutter splint applied, pain improved, remains NV intact.    Pt agrees to RICE therapy and close f/u with ortho.  Referral given. Has pain medication at home.  Miniya Miguez L. Trisha Mangle, PA-C 03/16/13 1244

## 2013-03-16 NOTE — ED Provider Notes (Signed)
Medical screening examination/treatment/procedure(s) were performed by non-physician practitioner and as supervising physician I was immediately available for consultation/collaboration.    Terrionna Bridwell J. Reiana Poteet, MD 03/16/13 1344 

## 2013-03-17 ENCOUNTER — Ambulatory Visit (INDEPENDENT_AMBULATORY_CARE_PROVIDER_SITE_OTHER): Payer: Medicare PPO | Admitting: Orthopedic Surgery

## 2013-03-17 VITALS — BP 134/96 | Ht 73.0 in | Wt 221.0 lb

## 2013-03-17 DIAGNOSIS — S62309A Unspecified fracture of unspecified metacarpal bone, initial encounter for closed fracture: Secondary | ICD-10-CM

## 2013-03-17 NOTE — Patient Instructions (Addendum)
Keep cast dry 

## 2013-03-18 ENCOUNTER — Encounter: Payer: Self-pay | Admitting: Orthopedic Surgery

## 2013-03-18 DIAGNOSIS — S62309A Unspecified fracture of unspecified metacarpal bone, initial encounter for closed fracture: Secondary | ICD-10-CM | POA: Insufficient documentation

## 2013-03-18 NOTE — Progress Notes (Signed)
  Subjective:    Patient ID: Dwayne Anthony, male    DOB: 05/24/1956, 57 y.o.   MRN: [id number]  Hand Pain  The incident occurred 3 to 5 days ago. The incident occurred at home. The injury mechanism was a direct blow. The pain is present in the right hand. The quality of the pain is described as aching. The pain is at a severity of 10/10. The pain has been constant since the incident.      Review of Systems  Musculoskeletal: Positive for joint swelling.  All other systems reviewed and are negative.       Objective:   Physical Exam  Nursing note and vitals reviewed. Constitutional: He is oriented to person, place, and time. He appears well-developed and well-nourished. No distress.  HENT:  Head: Normocephalic.  Cardiovascular: Intact distal pulses.   Musculoskeletal:       Right upper arm: Normal.       Right forearm: Normal.       Right hand: He exhibits decreased range of motion, tenderness, bony tenderness and swelling. He exhibits normal capillary refill, no deformity and no laceration. Decreased sensation is not present in the ulnar distribution, is not present in the medial distribution and is not present in the radial distribution. Decreased strength noted. He exhibits thumb/finger opposition. He exhibits no finger abduction and no wrist extension trouble.  Lymphadenopathy:       Right: No epitrochlear adenopathy present.       Left: No epitrochlear adenopathy present.  Neurological: He is alert and oriented to person, place, and time. He has normal reflexes. He displays no atrophy. He exhibits normal muscle tone.  Right hand grip weakness   Skin: Skin is warm and dry. No rash noted. He is not diaphoretic. No erythema.  Psychiatric: He has a normal mood and affect. His behavior is normal. Judgment and thought content normal.          Assessment & Plan:   Encounter Diagnosis  Name Primary?  . Metacarpal bone fracture, closed, initial encounter Yes    Sac with 4/5  digit immobilized and incorporated   Cast change in 2 weeks

## 2013-03-31 ENCOUNTER — Ambulatory Visit (INDEPENDENT_AMBULATORY_CARE_PROVIDER_SITE_OTHER): Payer: Medicare PPO | Admitting: Orthopedic Surgery

## 2013-03-31 ENCOUNTER — Encounter: Payer: Self-pay | Admitting: Orthopedic Surgery

## 2013-03-31 VITALS — BP 136/90 | Ht 73.0 in | Wt 221.0 lb

## 2013-03-31 DIAGNOSIS — S62309D Unspecified fracture of unspecified metacarpal bone, subsequent encounter for fracture with routine healing: Secondary | ICD-10-CM

## 2013-03-31 DIAGNOSIS — S5290XD Unspecified fracture of unspecified forearm, subsequent encounter for closed fracture with routine healing: Secondary | ICD-10-CM

## 2013-03-31 NOTE — Patient Instructions (Signed)
Keep  Cast dry   Do not get wet   If it gets wet dry with a hair dryer on low setting and call the office   

## 2013-03-31 NOTE — Progress Notes (Signed)
Patient ID: Dwayne Anthony, male   DOB: 08-27-1955, 57 y.o.   MRN: [id number] Chief Complaint  Patient presents with  . Follow-up    2 week recheck right hand fracture, change cast/ fracture 03-15-13 DOI   BP 136/90  Ht 6\' 1"  (1.854 m)  Wt 221 lb (100.245 kg)  BMI 29.16 kg/m2  Encounter Diagnosis  Name Primary?  . Metacarpal bone fracture, with routine healing, subsequent encounter Yes   Cast change right hand the patient changed a short arm cast

## 2013-04-04 ENCOUNTER — Ambulatory Visit: Payer: Medicare PPO | Admitting: Orthopedic Surgery

## 2013-04-28 ENCOUNTER — Ambulatory Visit (INDEPENDENT_AMBULATORY_CARE_PROVIDER_SITE_OTHER): Payer: Medicare PPO

## 2013-04-28 ENCOUNTER — Ambulatory Visit (INDEPENDENT_AMBULATORY_CARE_PROVIDER_SITE_OTHER): Payer: Self-pay | Admitting: Orthopedic Surgery

## 2013-04-28 VITALS — BP 148/86 | Ht 73.0 in | Wt 221.0 lb

## 2013-04-28 DIAGNOSIS — IMO0001 Reserved for inherently not codable concepts without codable children: Secondary | ICD-10-CM

## 2013-04-28 DIAGNOSIS — S6291XD Unspecified fracture of right wrist and hand, subsequent encounter for fracture with routine healing: Secondary | ICD-10-CM

## 2013-04-28 NOTE — Progress Notes (Signed)
Patient ID: Dwayne Anthony, male   DOB: [birth year], 57 y.o.   MRN: [id number]   Chief Complaint  Patient presents with  . Follow-up    4 week recheck right hand fracture DOI 03/15/13    Blood pressure 148/86, height 6\' 1"  (1.854 m), weight 221 lb (100.245 kg).  Encounter Diagnosis  Name Primary?  . Right hand fracture, with routine healing, subsequent encounter Yes    This is the six-week followup status post fourth metacarpal fracture treated with casting  X-rays out of plaster show fracture callus around the fracture site and healing of the fracture  The patient will be advised to start range of motion exercises and return in 6 weeks for reevaluation of his motion.

## 2013-04-28 NOTE — Patient Instructions (Addendum)
activities as tolerated 

## 2013-04-29 ENCOUNTER — Emergency Department (HOSPITAL_COMMUNITY)
Admission: EM | Admit: 2013-04-29 | Discharge: 2013-04-30 | Disposition: A | Discharge status: Home / Self Care | Payer: Medicare PPO | Attending: Emergency Medicine | Admitting: Emergency Medicine

## 2013-04-29 ENCOUNTER — Emergency Department (HOSPITAL_COMMUNITY): Payer: Medicare PPO

## 2013-04-29 ENCOUNTER — Encounter (HOSPITAL_COMMUNITY): Payer: Self-pay | Admitting: Emergency Medicine

## 2013-04-29 DIAGNOSIS — W08XXXA Fall from other furniture, initial encounter: Secondary | ICD-10-CM | POA: Insufficient documentation

## 2013-04-29 DIAGNOSIS — I10 Essential (primary) hypertension: Secondary | ICD-10-CM | POA: Insufficient documentation

## 2013-04-29 DIAGNOSIS — Z79899 Other long term (current) drug therapy: Secondary | ICD-10-CM | POA: Insufficient documentation

## 2013-04-29 DIAGNOSIS — Z7982 Long term (current) use of aspirin: Secondary | ICD-10-CM | POA: Insufficient documentation

## 2013-04-29 DIAGNOSIS — Z87891 Personal history of nicotine dependence: Secondary | ICD-10-CM | POA: Insufficient documentation

## 2013-04-29 DIAGNOSIS — Y939 Activity, unspecified: Secondary | ICD-10-CM | POA: Insufficient documentation

## 2013-04-29 DIAGNOSIS — S298XXA Other specified injuries of thorax, initial encounter: Secondary | ICD-10-CM

## 2013-04-29 DIAGNOSIS — Y929 Unspecified place or not applicable: Secondary | ICD-10-CM | POA: Insufficient documentation

## 2013-04-29 NOTE — ED Notes (Signed)
Pain rt antero-lateral chest , fell off couch 2 weeks ago and getting progressively worse  With sob.No fever.  No cough

## 2013-04-29 NOTE — ED Notes (Signed)
Family at bedside. Assisted patient to restroom. 

## 2013-04-29 NOTE — ED Provider Notes (Signed)
CSN: [id number]     Arrival date & time 04/29/13  2237 History   First MD Initiated Contact with Patient 04/29/13 2313     Chief Complaint  Patient presents with  . Rib Injury   (Consider location/radiation/quality/duration/timing/severity/associated sxs/prior Treatment) HPI This is a 57 year old male fell off the couch about a week ago and continues to have pain in the right lateral inferior rib area. The pain has become worse and is now moderate to severe. It is worse with deep breathing or coughing and makes coughing or deep breathing difficult. He denies fever. He denies productive cough. It has made him short of breath. He has gotten inadequate relief with his regularly scheduled chronic pain medication (oxycodone 10mg ).  Past Medical History  Diagnosis Date  . Hypertension    Past Surgical History  Procedure Laterality Date  . Knee surgery Left   . Eye surgery    . Appendectomy    . Chest surgery to repair laceration     History reviewed. No pertinent family history. History  Substance Use Topics  . Smoking status: Former Games developer  . Smokeless tobacco: Never Used  . Alcohol Use: Yes    Review of Systems  All other systems reviewed and are negative.    Allergies  Review of patient's allergies indicates no known allergies.  Home Medications   Current Outpatient Rx  Name  Route  Sig  Dispense  Refill  . amLODipine (NORVASC) 10 MG tablet   Oral   Take 10 mg by mouth daily.         Marland Kitchen aspirin EC 81 MG tablet   Oral   Take 81 mg by mouth daily.         Marland Kitchen atorvastatin (LIPITOR) 40 MG tablet   Oral   Take 40 mg by mouth daily.         . enalapril (VASOTEC) 20 MG tablet   Oral   Take 20 mg by mouth daily.         Marland Kitchen escitalopram (LEXAPRO) 10 MG tablet   Oral   Take 10 mg by mouth daily.         . fish oil-omega-3 fatty acids 1000 MG capsule   Oral   Take 1 g by mouth daily.         . ondansetron (ZOFRAN ODT) 4 MG disintegrating tablet   Oral  Take 1 tablet (4 mg total) by mouth every 8 (eight) hours as needed for nausea.   10 tablet   0   . oxyCODONE (OXYCONTIN) 10 MG 12 hr tablet   Oral   Take 10 mg by mouth every 12 (twelve) hours as needed for pain.         . promethazine (PHENERGAN) 25 MG tablet   Oral   Take 1 tablet (25 mg total) by mouth every 6 (six) hours as needed for nausea.   12 tablet   0    BP 126/77  Pulse 85  Temp(Src) 97.5 F (36.4 C) (Oral)  Resp 20  Ht 6\' 1"  (1.854 m)  Wt 215 lb (97.523 kg)  BMI 28.37 kg/m2  SpO2 98%  Physical Exam General: Well-developed, well-nourished male in no acute distress; appearance consistent with age of record HENT: normocephalic; atraumatic Eyes: pupils equal, round and reactive to light; extraocular muscles intact Neck: supple Heart: regular rate and rhythm Lungs: clear to auscultation bilaterally Chest: Right lateral lower rib tenderness without deformity or crepitus Abdomen: soft; nondistended; nontender; bowel sounds present  Extremities: No deformity; full range of motion Neurologic: Awake, alert and oriented; motor function intact in all extremities and symmetric; no facial droop Skin: Warm and dry Psychiatric: Normal mood and affect    ED Course  Procedures (including critical care time)    MDM   Nursing notes and vitals signs, including pulse oximetry, reviewed.  Summary of this visit's results, reviewed by myself:  Labs:  No results found for this or any previous visit (from the past 24 hour(s)).  Imaging Studies: Dg Ribs Unilateral W/chest Right  04/29/2013   CLINICAL DATA:  Rib injury.  EXAM: RIGHT RIBS AND CHEST - 3+ VIEW  COMPARISON:  11/25/2012  FINDINGS: Posterior right 8th rib fracture, unchanged in appearance since 10/02/2008. No acute fracture seen. No evidence of hemothorax, lung contusion, or pneumothorax. Normal heart size.  IMPRESSION: 1. No acute rib fracture seen. 2. Remote posterior right 8th rib fracture. 3. No evidence of  acute cardiopulmonary disease.   Electronically Signed   By: Tiburcio Pea M.D.   On: 04/29/2013 23:51   Dg Hand Complete Right  04/28/2013   Fracture followup  X-rays right hand fourth metacarpal fracture  The fracture alignment is normal and there is abundant callus bridging the  fracture the fracture line is nearly obliterated  Impression healed fourth metacarpal fracture midshaft  11:57 PM Suspect occult rib fracture along the costochondral junction. The patient declines any additional analgesics at this time.     Hanley Seamen, MD 04/29/13 [phone]

## 2013-04-30 NOTE — ED Notes (Signed)
Respiratory paged to provide Incentive Spirometry / teaching prior to discharge

## 2013-08-04 ENCOUNTER — Other Ambulatory Visit (HOSPITAL_COMMUNITY): Payer: Self-pay | Admitting: Family Medicine

## 2013-08-04 ENCOUNTER — Ambulatory Visit (HOSPITAL_COMMUNITY)
Admission: RE | Admit: 2013-08-04 | Discharge: 2013-08-04 | Disposition: A | Discharge status: Home / Self Care | Payer: Medicare PPO | Source: Ambulatory Visit | Attending: Family Medicine | Admitting: Family Medicine

## 2013-08-04 DIAGNOSIS — G8929 Other chronic pain: Secondary | ICD-10-CM | POA: Insufficient documentation

## 2013-08-04 DIAGNOSIS — M25569 Pain in unspecified knee: Secondary | ICD-10-CM | POA: Insufficient documentation

## 2014-10-07 ENCOUNTER — Encounter (HOSPITAL_COMMUNITY): Payer: Self-pay | Admitting: Emergency Medicine

## 2014-10-07 ENCOUNTER — Emergency Department (HOSPITAL_COMMUNITY)
Admission: EM | Admit: 2014-10-07 | Discharge: 2014-10-07 | Disposition: A | Discharge status: Home / Self Care | Payer: Medicare PPO | Attending: Emergency Medicine | Admitting: Emergency Medicine

## 2014-10-07 DIAGNOSIS — Z79899 Other long term (current) drug therapy: Secondary | ICD-10-CM | POA: Insufficient documentation

## 2014-10-07 DIAGNOSIS — Z7982 Long term (current) use of aspirin: Secondary | ICD-10-CM | POA: Insufficient documentation

## 2014-10-07 DIAGNOSIS — F1010 Alcohol abuse, uncomplicated: Secondary | ICD-10-CM | POA: Diagnosis not present

## 2014-10-07 DIAGNOSIS — R45851 Suicidal ideations: Secondary | ICD-10-CM

## 2014-10-07 DIAGNOSIS — F329 Major depressive disorder, single episode, unspecified: Secondary | ICD-10-CM | POA: Diagnosis not present

## 2014-10-07 DIAGNOSIS — Z87891 Personal history of nicotine dependence: Secondary | ICD-10-CM | POA: Diagnosis not present

## 2014-10-07 DIAGNOSIS — F1310 Sedative, hypnotic or anxiolytic abuse, uncomplicated: Secondary | ICD-10-CM | POA: Diagnosis not present

## 2014-10-07 DIAGNOSIS — I10 Essential (primary) hypertension: Secondary | ICD-10-CM | POA: Diagnosis not present

## 2014-10-07 DIAGNOSIS — Z008 Encounter for other general examination: Secondary | ICD-10-CM | POA: Diagnosis present

## 2014-10-07 LAB — RAPID URINE DRUG SCREEN, HOSP PERFORMED
AMPHETAMINES: NOT DETECTED
BARBITURATES: NOT DETECTED
BENZODIAZEPINES: POSITIVE — AB
COCAINE: NOT DETECTED
OPIATES: NOT DETECTED
TETRAHYDROCANNABINOL: NOT DETECTED

## 2014-10-07 LAB — COMPREHENSIVE METABOLIC PANEL
ALBUMIN: 5.1 g/dL (ref 3.5–5.2)
ALK PHOS: 54 U/L (ref 39–117)
ALT: 34 U/L (ref 0–53)
AST: 33 U/L (ref 0–37)
Anion gap: 13 (ref 5–15)
BUN: 13 mg/dL (ref 6–23)
CO2: 24 mmol/L (ref 19–32)
CREATININE: 0.81 mg/dL (ref 0.50–1.35)
Calcium: 9.8 mg/dL (ref 8.4–10.5)
Chloride: 100 mmol/L (ref 96–112)
GLUCOSE: 96 mg/dL (ref 70–99)
POTASSIUM: 3.9 mmol/L (ref 3.5–5.1)
SODIUM: 137 mmol/L (ref 135–145)
Total Bilirubin: 1.2 mg/dL (ref 0.3–1.2)
Total Protein: 8 g/dL (ref 6.0–8.3)

## 2014-10-07 LAB — CBC WITH DIFFERENTIAL/PLATELET
BASOS ABS: 0 10*3/uL (ref 0.0–0.1)
BASOS PCT: 1 % (ref 0–1)
EOS ABS: 0.1 10*3/uL (ref 0.0–0.7)
Eosinophils Relative: 1 % (ref 0–5)
HEMATOCRIT: 45.4 % (ref 39.0–52.0)
Hemoglobin: 15.8 g/dL (ref 13.0–17.0)
LYMPHS ABS: 1.4 10*3/uL (ref 0.7–4.0)
LYMPHS PCT: 17 % (ref 12–46)
MCH: 31.2 pg (ref 26.0–34.0)
MCHC: 34.8 g/dL (ref 30.0–36.0)
MCV: 89.5 fL (ref 78.0–100.0)
Monocytes Absolute: 0.6 10*3/uL (ref 0.1–1.0)
Monocytes Relative: 7 % (ref 3–12)
NEUTROS ABS: 6 10*3/uL (ref 1.7–7.7)
Neutrophils Relative %: 74 % (ref 43–77)
Platelets: 247 10*3/uL (ref 150–400)
RBC: 5.07 MIL/uL (ref 4.22–5.81)
RDW: 12.9 % (ref 11.5–15.5)
WBC: 8.1 10*3/uL (ref 4.0–10.5)

## 2014-10-07 LAB — SALICYLATE LEVEL: Salicylate Lvl: <4 mg/dL (ref 2.8–20.0)

## 2014-10-07 LAB — ETHANOL

## 2014-10-07 MED ORDER — LORAZEPAM 1 MG PO TABS
0.0000 mg | ORAL_TABLET | Freq: Four times a day (QID) | ORAL | Status: DC
  Administered 2014-10-07: 1 mg via ORAL
  Filled 2014-10-07: qty 1

## 2014-10-07 MED ORDER — LORAZEPAM 1 MG PO TABS: 0.0000 mg | ORAL_TABLET | Freq: Two times a day (BID) | ORAL | Status: DC

## 2014-10-07 MED ORDER — VITAMIN B-1 100 MG PO TABS: 100.0000 mg | ORAL_TABLET | Freq: Every day | ORAL | Status: DC

## 2014-10-07 MED ORDER — THIAMINE HCL 100 MG/ML IJ SOLN: 100.0000 mg | Freq: Every day | INTRAMUSCULAR | Status: DC

## 2014-10-07 NOTE — ED Notes (Signed)
Tele psych being performed at present time, pt given microwave meal, sitter remains at bedside,

## 2014-10-07 NOTE — ED Notes (Signed)
Pt requesting something to help him sleep when he gets home before he leaves, Dr Rubin PayorPickering notified, order given to given 1 mg ativan
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
## 2014-10-07 NOTE — Discharge Instructions (Signed)
Ethanol This is a test of the blood alcohol level and is used to determine whether your level will impair your driving or other activities and also to determine the possibility of overdose (alcohol poisoning). PREPARATION FOR TEST No preparation or fasting is necessary. NORMAL FINDINGS 1. Negative 2. Possible critical values are greater than 300 mg/dl or greater than 65 mmol/L (SI units). Ranges for normal findings may vary among different laboratories and hospitals. You should always check with your doctor after having lab work or other tests done to discuss the meaning of your test results and whether your values are considered within normal limits. MEANING OF TEST  Your caregiver will go over the test results with you and discuss the importance and meaning of your results, as well as treatment options and the need for additional tests if necessary. OBTAINING THE TEST RESULTS  It is your responsibility to obtain your test results. Ask the lab or department performing the test when and how you will get your results. Document Released: 07/23/2004 Document Revised: 09/22/2011 Document Reviewed: 06/09/2008 [facility] Patient Information 2015 [place], [place]. This information is not intended to replace advice given to you by your health care provider. Make sure you discuss any questions you have with your health care provider.  Depression Depression refers to feeling sad, low, down in the dumps, blue, gloomy, or empty. In general, there are two kinds of depression: 3. Normal sadness or normal grief. This kind of depression is one that we all feel from time to time after upsetting life experiences, such as the loss of a job or the ending of a relationship. This kind of depression is considered normal, is short lived, and resolves within a few days to 2 weeks. Depression experienced after the loss of a loved one (bereavement) often lasts longer than 2 weeks but normally gets better with time. 4. Clinical  depression. This kind of depression lasts longer than normal sadness or normal grief or interferes with your ability to function at home, at work, and in school. It also interferes with your personal relationships. It affects almost every aspect of your life. Clinical depression is an illness. Symptoms of depression can also be caused by conditions other than those mentioned above, such as:  Physical illness. Some physical illnesses, including underactive thyroid gland (hypothyroidism), severe anemia, specific types of cancer, diabetes, uncontrolled seizures, heart and lung problems, strokes, and chronic pain are commonly associated with symptoms of depression.  Side effects of some prescription medicine. In some people, certain types of medicine can cause symptoms of depression.  Substance abuse. Abuse of alcohol and illicit drugs can cause symptoms of depression. SYMPTOMS Symptoms of normal sadness and normal grief include the following:  Feeling sad or crying for short periods of time.  Not caring about anything (apathy).  Difficulty sleeping or sleeping too much.  No longer able to enjoy the things you used to enjoy.  Desire to be by oneself all the time (social isolation).  Lack of energy or motivation.  Difficulty concentrating or remembering.  Change in appetite or weight.  Restlessness or agitation. Symptoms of clinical depression include the same symptoms of normal sadness or normal grief and also the following symptoms:  Feeling sad or crying all the time.  Feelings of guilt or worthlessness.  Feelings of hopelessness or helplessness.  Thoughts of suicide or the desire to harm yourself (suicidal ideation).  Loss of touch with reality (psychotic symptoms). Seeing or hearing things that are not real (hallucinations) or  having false beliefs about your life or the people around you (delusions and paranoia). DIAGNOSIS  The diagnosis of clinical depression is usually based  on how bad the symptoms are and how long they have lasted. Your health care provider will also ask you questions about your medical history and substance use to find out if physical illness, use of prescription medicine, or substance abuse is causing your depression. Your health care provider may also order blood tests. TREATMENT  Often, normal sadness and normal grief do not require treatment. However, sometimes antidepressant medicine is given for bereavement to ease the depressive symptoms until they resolve. The treatment for clinical depression depends on how bad the symptoms are but often includes antidepressant medicine, counseling with a mental health professional, or both. Your health care provider will help to determine what treatment is best for you. Depression caused by physical illness usually goes away with appropriate medical treatment of the illness. If prescription medicine is causing depression, talk with your health care provider about stopping the medicine, decreasing the dose, or changing to another medicine. Depression caused by the abuse of alcohol or illicit drugs goes away when you stop using these substances. Some adults need professional help in order to stop drinking or using drugs. SEEK IMMEDIATE MEDICAL CARE IF:  You have thoughts about hurting yourself or others.  You lose touch with reality (have psychotic symptoms).  You are taking medicine for depression and have a serious side effect. FOR MORE INFORMATION  National Alliance on Mental Illness: www.nami.[employer] of Mental Health: http://www.maynard.net/ Document Released: 06/27/2000 Document Revised: 11/14/2013 Document Reviewed: 09/29/2011 [facility] Patient Information 2015 [place], [place]. This information is not intended to replace advice given to you by your health care provider. Make sure you discuss any questions you have with your health care provider. Emergency Department Resource Guide 1) Find a  Doctor and Pay Out of Pocket Although you won't have to find out who is covered by your insurance plan, it is a good idea to ask around and get recommendations. You will then need to call the office and see if the doctor you have chosen will accept you as a new patient and what types of options they offer for patients who are self-pay. Some doctors offer discounts or will set up payment plans for their patients who do not have insurance, but you will need to ask so you aren't surprised when you get to your appointment.  2) Contact Your Local Health Department Not all health departments have doctors that can see patients for sick visits, but many do, so it is worth a call to see if yours does. If you don't know where your local health department is, you can check in your phone book. The CDC also has a tool to help you locate your state's health department, and many state websites also have listings of all of their local health departments.  3) Find a Walk-in Clinic If your illness is not likely to be very severe or complicated, you may want to try a walk in clinic. These are popping up all over the country in pharmacies, drugstores, and shopping centers. They're usually staffed by nurse practitioners or physician assistants that have been trained to treat common illnesses and complaints. They're usually fairly quick and inexpensive. However, if you have serious medical issues or chronic medical problems, these are probably not your best option.  No Primary Care Doctor: - Call Health Connect at  [phone] - they can help  you locate a primary care doctor that  accepts your insurance, provides certain services, etc. - Physician Referral Service- [phone]  Chronic Pain Problems: Organization         Address     Phone             Notes  [facility]  [phone] Patients need to be referred by their primary care doctor.   Medication Assistance: Organization         Address      Phone             Notes  [facility] Medication [facility] [address] [place], [place] [address] [phone] --Must be a resident of [facility] -- Must have NO insurance coverage whatsoever (no Medicaid/ Medicare, etc.) -- The pt. MUST have a primary care doctor that directs their care regularly and follows them in the community   [facility]ist  [phone]   [employer]  [phone]    Agencies that provide inexpensive medical care: Organization         Address     Phone             Notes  [facility]  [phone]   [facility]    [phone]   [facility] [address] [place], [place] [address] [phone]   [facility] [address], [place] [phone]   Planned Parenthood    [phone]   [facility]    [phone]   [facility]  [address], [place] Phone:  [phone], Fax:  [phone] Hours of Operation:  9 am - 6 pm, M-F.  Also accepts Medicaid/Medicare and self-pay.  [facility]  [address], [place] Phone: [phone], Fax: [phone]. Hours of Operation:  8:30 am - 5:30 pm, M-F.  Also accepts Medicaid and self-pay.  [facility] [place] [address] [place] Phone: [phone]   [facility]     [address] Natasha Bence [place], [place] [phone], Ext. [phone] Mondays & Thursdays: 7-9 AM.  First 15 patients are seen on a first come, first serve basis.   [facility] [address] [place] [address] [phone] Accepts Medicaid   Medicaid-accepting [facility] Providers:  Organization         Address     Phone             Notes  [facility] [address], [place] [phone] Also accepts self-pay patients.  [facility] [address] Laurell Josephs [place], [place]  [phone]   [facility] [address], [place] [phone]   [facility] [address], [place] [phone]   Renaye Rakers [address], [place]   [phone] Only accepts [place] patients after they have their name applied to their card.   Self-Pay (no insurance) in [facility]:  Organization         Address     Phone             Notes  Sickle Cell Patients, [facility] [address] [place], [place] [phone]   [facility]  [address], [place] [phone]   [facility] [place]  [address], [place] [phone]) [phone]   [facility]/Dr. Osei-Bonsu  [address] [place] [address], [place] or [address], [place] [phone] Phone number for both [place] and [place] locations is the same.  [facility] [address], [place] [phone]) [phone]   [facility]  [address] [place] [address], [place] or [address] [phone]   [facility] [address], [place] [phone]) [phone], phone; [phone]) [phone], fax Sees patients 1st and 3rd Saturday of every month.  Must not qualify for public or private insurance (i.e. Medicaid, Medicare, [place] Health Choice, Veterans' Benefits)  Household income should be no more than 200% of the poverty level The clinic cannot treat you if you are pregnant or think you are pregnant  Sexually transmitted diseases are not treated at the clinic.    Dental Care:  Organization         Address     Phone             Notes  [facility] [address], [place] [phone]) [phone] Accepts children up to age 61 who are enrolled in [place] or [place] Health Choice; pregnant women with a Medicaid card; and children who  have applied for Medicaid or [place] Health Choice, but were declined, whose parents can pay a reduced fee at time of service.  [facility] [place]  [address], [place] [phone] Accepts children up to age 61 who are enrolled in [place] or [place] Health Choice; pregnant women with a Medicaid card; and children who have applied for Medicaid or [facility] Health Choice, but were declined, whose parents can pay a reduced fee at time of service.  [facility]  [address], [place] [phone]) [phone] Patients are seen by appointment only. Walk-ins are not accepted. [facility] will see patients 61 years of age and older. Monday - Tuesday (8am-5pm) Most Wednesdays (8:30-5pm) $30 per visit, cash only  [facility]  [address], [facility] [phone]) [phone] Patients are seen by appointment only. Walk-ins are not accepted. [facility] will see patients 61 years of age and older. One Wednesday Evening (Monthly: Volunteer Based).  $30 per visit, cash only  [employer] [facility] [facility]  [phone] for adults; Children under age 364, call Graduate Pediatric Dentistry at [phone]. Children aged 484-14, please call [phone] to request a pediatric application.  Dental services are provided in all areas of dental care including fillings, crowns and bridges, complete and partial dentures, implants, gum treatment, root canals, and extractions. Preventive care is also provided. Treatment is provided to both adults and children. Patients are selected via a lottery and there is often a waiting list.   [facility] [address], [place]  [phone] [email or web address]   [facility] [address], [place], [place] [phone], Ext. [phone] Second and Fourth Thursday of each month, opens at 6:30 AM; Clinic ends at 9 AM.  Patients are seen on a first-come first-served basis, and a  limited number are seen during each clinic.   [facility]  [address], [place], [place] [phone]) [phone]   Eligibility Requirements You must have lived  in [place], [place], or [place] counties for at least the last three months.   You cannot be eligible for state or federal sponsored [place], including [employer], [place], or [employer].   You generally cannot be eligible for healthcare insurance through your employer.    How to apply: Eligibility screenings are held every Tuesday and Wednesday afternoon from 1:00 pm until 4:00 pm. You do not need an appointment for the interview!  [facility] [address], [place], [place] [phone]   [facility] Health Department  [phone]   [facility] Health Department  [phone]   [facility] Health Department  [phone]    Behavioral Health Resources in the Community: Intensive Outpatient Programs Organization         Address     Phone             Notes  [facility] Services [address], [place], [place] [phone]   [facility] [address] [place], [place] [phone]   [facility] [address] [place], [place]  [phone]   [facility] Mental Health [address]  [place], [place] [phone] or [phone]     Substance Abuse Resources Organization         Address     Phone             Notes  Alcohol [facility] Drug Services  [phone]   [facility]  [phone]   [employer]  [phone]   Floydene Flock  [phone]   Residential & Outpatient Substance Abuse Program  [phone]   Psychological Services Organization         Address     Phone             Notes  [facility] Behavior[facility] Health  [phone]   [facility]  [phone]   [facility] [address] [place] [phone] or [phone]    Mobile Crisis  Teams Organization         Address     Phone             Notes  [facility], Mobile Crisis Care Unit  [phone]   [facility]  [address] [place], [place] [phone]   Doristine Locks [address] [place] [phone]    Self-Help/Support Groups Organization         Address     Phone             Notes  [facility] [place] - variety of support groups  [phone]- [phone] Call for more information  Narcotics Anonymous (NA), [facility] [address], [employer] [place]  2 meetings at this location   Statistician         Address     Phone             Notes  [facility] [address],    [place]  [phone]   [facility]  [address] [place] [address], [place], [place] [phone]   [facility] [address] [place], [place] [phone] Admissions: 8am-3pm M-F  [facility] [address],    [place], [place] [phone]   [facility] [address] Starling Manns [place], [place] [phone]   [facility] [address]  [place], [place] [phone]   [facility] -  Intensive Outpatient [address] Laurell Josephs [address], [place], [place] [phone]   [facility] ([facility] [address]  [place], [place] [phone] or [phone]   [facility] ([facility]) [address], [place], [place] [phone] Accepts Medicaid  [facility] [address],  [place] [phone] Substance Abuse/Addiction Treatment   [facility] Organization         Address     Phone             Notes  [facility]  [phone]   Angie Fava, PhD [address] Ervin Knack [place], [place]   [phone] or [phone]   [facility] Behavioral   [address] [place], [place] [phone]   [facility] [address], [place], [place] [phone] Insurance/Medicaid/sponsorship through [facility] [address]                                    [place], [place] [phone] Therapy/tele-psych/case  [facility] [address], [place] [phone]    Dr. Lolly Mustache  [phone]   [facility]  [employer] [facility] Dept. 1) [address], [place] 2) [address] [place] 3)  [address], [place] [phone]  [phone]   [facility] Child Abuse Hotline 803-515-8588 or [phone] (After Hours)

## 2014-10-07 NOTE — ED Notes (Addendum)
Pt was able to obtain ride from his nephew Sam, pt states that he will be waiting in the waiting room for his ride,

## 2015-02-16 ENCOUNTER — Emergency Department (HOSPITAL_COMMUNITY)
Admission: EM | Admit: 2015-02-16 | Discharge: 2015-02-17 | Disposition: A | Discharge status: Home / Self Care | Payer: Medicare PPO | Attending: Emergency Medicine | Admitting: Emergency Medicine

## 2015-02-16 ENCOUNTER — Encounter (HOSPITAL_COMMUNITY): Payer: Self-pay | Admitting: *Deleted

## 2015-02-16 DIAGNOSIS — S0081XA Abrasion of other part of head, initial encounter: Secondary | ICD-10-CM | POA: Insufficient documentation

## 2015-02-16 DIAGNOSIS — Z79899 Other long term (current) drug therapy: Secondary | ICD-10-CM | POA: Diagnosis not present

## 2015-02-16 DIAGNOSIS — S29001A Unspecified injury of muscle and tendon of front wall of thorax, initial encounter: Secondary | ICD-10-CM | POA: Diagnosis present

## 2015-02-16 DIAGNOSIS — Y9389 Activity, other specified: Secondary | ICD-10-CM | POA: Insufficient documentation

## 2015-02-16 DIAGNOSIS — F10920 Alcohol use, unspecified with intoxication, uncomplicated: Secondary | ICD-10-CM

## 2015-02-16 DIAGNOSIS — Z7982 Long term (current) use of aspirin: Secondary | ICD-10-CM | POA: Diagnosis not present

## 2015-02-16 DIAGNOSIS — F10120 Alcohol abuse with intoxication, uncomplicated: Secondary | ICD-10-CM | POA: Insufficient documentation

## 2015-02-16 DIAGNOSIS — F1210 Cannabis abuse, uncomplicated: Secondary | ICD-10-CM | POA: Diagnosis not present

## 2015-02-16 DIAGNOSIS — Y998 Other external cause status: Secondary | ICD-10-CM | POA: Diagnosis not present

## 2015-02-16 DIAGNOSIS — F1310 Sedative, hypnotic or anxiolytic abuse, uncomplicated: Secondary | ICD-10-CM | POA: Insufficient documentation

## 2015-02-16 DIAGNOSIS — Y9289 Other specified places as the place of occurrence of the external cause: Secondary | ICD-10-CM | POA: Diagnosis not present

## 2015-02-16 DIAGNOSIS — W01198A Fall on same level from slipping, tripping and stumbling with subsequent striking against other object, initial encounter: Secondary | ICD-10-CM | POA: Diagnosis not present

## 2015-02-16 DIAGNOSIS — Z87891 Personal history of nicotine dependence: Secondary | ICD-10-CM | POA: Diagnosis not present

## 2015-02-16 DIAGNOSIS — F10230 Alcohol dependence with withdrawal, uncomplicated: Secondary | ICD-10-CM | POA: Diagnosis present

## 2015-02-16 DIAGNOSIS — R45851 Suicidal ideations: Secondary | ICD-10-CM

## 2015-02-16 DIAGNOSIS — I10 Essential (primary) hypertension: Secondary | ICD-10-CM | POA: Insufficient documentation

## 2015-02-16 DIAGNOSIS — S2232XA Fracture of one rib, left side, initial encounter for closed fracture: Secondary | ICD-10-CM | POA: Insufficient documentation

## 2015-02-16 MED ORDER — HYDROCODONE-ACETAMINOPHEN 5-325 MG PO TABS
1.0000 | ORAL_TABLET | Freq: Once | ORAL | Status: AC
Start: 2015-02-17 — End: 2015-02-17
  Administered 2015-02-17: 1 via ORAL
  Filled 2015-02-16: qty 1

## 2015-02-16 NOTE — ED Notes (Signed)
Pt c/o left sided rib pain after falling on a rock earlier; pt admits to drinking alcohol this evening

## 2015-02-17 ENCOUNTER — Emergency Department (HOSPITAL_COMMUNITY): Payer: Medicare PPO

## 2015-02-17 DIAGNOSIS — F10230 Alcohol dependence with withdrawal, uncomplicated: Secondary | ICD-10-CM

## 2015-02-17 DIAGNOSIS — Y906 Blood alcohol level of 120-199 mg/100 ml: Secondary | ICD-10-CM

## 2015-02-17 LAB — COMPREHENSIVE METABOLIC PANEL
ALT: 38 U/L (ref 17–63)
ANION GAP: 11 (ref 5–15)
AST: 37 U/L (ref 15–41)
Albumin: 4.6 g/dL (ref 3.5–5.0)
Alkaline Phosphatase: 50 U/L (ref 38–126)
BILIRUBIN TOTAL: 0.5 mg/dL (ref 0.3–1.2)
BUN: 12 mg/dL (ref 6–20)
CO2: 24 mmol/L (ref 22–32)
Calcium: 8.9 mg/dL (ref 8.9–10.3)
Chloride: 97 mmol/L — ABNORMAL LOW (ref 101–111)
Creatinine, Ser: 0.73 mg/dL (ref 0.61–1.24)
GFR calc Af Amer: >60 mL/min (ref >60)
GLUCOSE: 102 mg/dL — AB (ref 65–99)
POTASSIUM: 4.4 mmol/L (ref 3.5–5.1)
Sodium: 132 mmol/L — ABNORMAL LOW (ref 135–145)
Total Protein: 7.2 g/dL (ref 6.5–8.1)

## 2015-02-17 LAB — RAPID URINE DRUG SCREEN, HOSP PERFORMED
AMPHETAMINES: NOT DETECTED
Barbiturates: NOT DETECTED
Benzodiazepines: POSITIVE — AB
Cocaine: NOT DETECTED
Opiates: NOT DETECTED
Tetrahydrocannabinol: POSITIVE — AB

## 2015-02-17 LAB — CBC WITH DIFFERENTIAL/PLATELET
Basophils Absolute: 0 10*3/uL (ref 0.0–0.1)
Basophils Relative: 0 % (ref 0–1)
EOS ABS: 0 10*3/uL (ref 0.0–0.7)
EOS PCT: 0 % (ref 0–5)
HCT: 40.6 % (ref 39.0–52.0)
Hemoglobin: 13.8 g/dL (ref 13.0–17.0)
Lymphocytes Relative: 11 % — ABNORMAL LOW (ref 12–46)
Lymphs Abs: 1.1 10*3/uL (ref 0.7–4.0)
MCH: 30.9 pg (ref 26.0–34.0)
MCHC: 34 g/dL (ref 30.0–36.0)
MCV: 90.8 fL (ref 78.0–100.0)
Monocytes Absolute: 0.5 10*3/uL (ref 0.1–1.0)
Monocytes Relative: 5 % (ref 3–12)
NEUTROS ABS: 8.3 10*3/uL — AB (ref 1.7–7.7)
NEUTROS PCT: 84 % — AB (ref 43–77)
Platelets: 263 10*3/uL (ref 150–400)
RBC: 4.47 MIL/uL (ref 4.22–5.81)
RDW: 13 % (ref 11.5–15.5)
WBC: 9.9 10*3/uL (ref 4.0–10.5)

## 2015-02-17 LAB — ETHANOL: ALCOHOL ETHYL (B): 192 mg/dL — AB (ref <5)

## 2015-02-17 MED ORDER — ASPIRIN EC 81 MG PO TBEC
81.0000 mg | DELAYED_RELEASE_TABLET | Freq: Every day | ORAL | Status: DC
  Administered 2015-02-17: 81 mg via ORAL
  Filled 2015-02-17: qty 1

## 2015-02-17 MED ORDER — OMEGA-3-ACID ETHYL ESTERS 1 G PO CAPS
1.0000 g | ORAL_CAPSULE | Freq: Every day | ORAL | Status: DC
  Administered 2015-02-17: 1 g via ORAL
  Filled 2015-02-17 (×3): qty 1

## 2015-02-17 MED ORDER — IBUPROFEN 800 MG PO TABS
800.0000 mg | ORAL_TABLET | Freq: Once | ORAL | Status: AC
  Administered 2015-02-17: 800 mg via ORAL
  Filled 2015-02-17: qty 1

## 2015-02-17 MED ORDER — AMLODIPINE BESYLATE 5 MG PO TABS
10.0000 mg | ORAL_TABLET | Freq: Every day | ORAL | Status: DC
  Administered 2015-02-17: 10 mg via ORAL
  Filled 2015-02-17: qty 2

## 2015-02-17 MED ORDER — ENALAPRIL MALEATE 20 MG PO TABS
20.0000 mg | ORAL_TABLET | Freq: Every day | ORAL | Status: DC
  Administered 2015-02-17: 20 mg via ORAL
  Filled 2015-02-17 (×3): qty 1

## 2015-02-17 MED ORDER — ATORVASTATIN CALCIUM 40 MG PO TABS
40.0000 mg | ORAL_TABLET | Freq: Every day | ORAL | Status: DC
  Administered 2015-02-17: 40 mg via ORAL
  Filled 2015-02-17 (×3): qty 1

## 2015-02-17 MED ORDER — OXYCODONE-ACETAMINOPHEN 5-325 MG PO TABS
1.0000 | ORAL_TABLET | Freq: Once | ORAL | Status: AC
  Administered 2015-02-17: 1 via ORAL
  Filled 2015-02-17: qty 1

## 2015-02-17 MED ORDER — ESCITALOPRAM OXALATE 10 MG PO TABS
10.0000 mg | ORAL_TABLET | Freq: Every day | ORAL | Status: DC
  Administered 2015-02-17: 10 mg via ORAL
  Filled 2015-02-17 (×3): qty 1

## 2015-02-17 MED ORDER — ONDANSETRON 4 MG PO TBDP: 4.0000 mg | ORAL_TABLET | Freq: Three times a day (TID) | ORAL | Status: DC | PRN

## 2015-02-17 MED ORDER — VITAMIN B-1 100 MG PO TABS
100.0000 mg | ORAL_TABLET | Freq: Every day | ORAL | Status: DC
  Administered 2015-02-17: 100 mg via ORAL
  Filled 2015-02-17: qty 1

## 2015-02-17 MED ORDER — OXYCODONE-ACETAMINOPHEN 5-325 MG PO TABS
2.0000 | ORAL_TABLET | Freq: Four times a day (QID) | ORAL | Status: DC | PRN
  Administered 2015-02-17: 2 via ORAL
  Filled 2015-02-17: qty 2

## 2015-02-17 MED ORDER — HYDROCODONE-ACETAMINOPHEN 5-325 MG PO TABS: 1.0000 | ORAL_TABLET | Freq: Four times a day (QID) | ORAL | Status: AC | PRN

## 2015-02-17 MED ORDER — IBUPROFEN 800 MG PO TABS
800.0000 mg | ORAL_TABLET | Freq: Three times a day (TID) | ORAL | Status: DC | PRN
  Administered 2015-02-17: 800 mg via ORAL
  Filled 2015-02-17: qty 1

## 2015-02-17 NOTE — BH Assessment (Addendum)
Tele Assessment Note   Dwayne Anthony is an 59 y.o. male, divorced, white who presents to [facility] ED after falling and injuring himself while intoxicated. Pt reports drinking approximately six cans of beer plus one pint of liquor 3-4 times per week. Pt states he has used alcohol all his adult life. He denies drinking daily or experiencing alcohol withdrawal symptoms. Pt also reports smoking one joint of marijuana infrequently. He denies any other substance abuse. Pt reports he has been depressed and anxious recently because he was arrested for intent to sell Oxycodone. Pt admits selling Oxycodone but denies using it himself. He has a court date 02/28/15 for trespassing and a court date 03/12/15 for intent to sell. Pt also reports he has gambling debts. Pt states, "I thought I was invincible but my past caught up to me." He also states, "I got myself into this situation and I'm going to have to get myself out." He reports he has to spend $2000 on a lawyer. Pt reports symptoms including decreased sleep, anxiety and feelings of guilt but denies other depressive symptoms. Per Dr. Kirtland Bouchard. Ward, Pt reported passive suicidal thoughts to her. Pt repeatedly denies suicidal ideation during assessment. He denies any history of suicide attempts or parasuicidal behavior. He denies any history of intentional self-injurious behavior. He denies any homicidal ideation or history of violence. Pt denies access to firearms.   Pt identifies his legal problems as his primary stressor. He is divorced with no children and lives alone. He states he has several family and friends who are supportive. He states he has been on disability since 2010 due to knee problems. He denies any history of inpatient or outpatient mental health or substance abuse treatment.  Spoke with Pt's niece, Teren Zurcher [phone], via telephone while she was at [facility]. Ms. Derrig states earlier tonight she and her brother witnessed Pt stating he didn't want  to live, that he "wanted God to take him" and that "he wanted to help God along." She reports Pt never mentioned a specific plan. She reports Pt has a long history of alcohol use and "in his current mental states I don't think he is safe to go home." She reports to her knowledge Pt has never attempted suicide but states he had "a mental breakdown" over Easter weekend and also verbalized suicidal ideation at that time.  Pt is casually dressed, somewhat disheveled, alert, oriented x4 with normal speech and normal motor behavior. Pt's blood alcohol and urine drug screen are pending and whether Pt is intoxicated is unclear. Eye contact is fair. Pt's mood is depressed and guilty; affect is congruent with mood. Pt made jokes at times during assessment. Thought process is coherent and relevant. There is no indication Pt is currently responding to internal stimuli or experiencing delusional thought content. Pt states he does not want treatment for his alcohol use and does not need mental health treatment. He states the only reason he came to the ED is because he injured himself and he wants to be discharged home.   Axis I: Alcohol Use Disorder, Severe; Cannabis Use Disorder, Mild; Axis II: Deferred Axis III:  Past Medical History  Diagnosis Date  . Hypertension    Axis IV: economic problems, other psychosocial or environmental problems and problems related to legal system/crime Axis V: GAF=40  Past Medical History:  Past Medical History  Diagnosis Date  . Hypertension     Past Surgical History  Procedure Laterality Date  . Knee surgery  Left   . Eye surgery    . Appendectomy    . Chest surgery to repair laceration      Family History:  Family History  Problem Relation Age of Onset  . Stroke Mother     Social History:  reports that he has quit smoking. He has never used smokeless tobacco. He reports that he drinks about 14.4 oz of alcohol per week. He reports that he uses illicit drugs  (Marijuana).  Additional Social History:  Alcohol / Drug Use Pain Medications: Denies abuse Prescriptions: Denies abuse Over the Counter: Uses Unisom and Tylenol PM for sleep History of alcohol / drug use?: Yes Longest period of sobriety (when/how long): Unknown Negative Consequences of Use: Legal Withdrawal Symptoms:  (Denies) Substance #1 Name of Substance 1: Alcohol 1 - Age of First Use: Adolescent 1 - Amount (size/oz): Approximately six beer plus one pint liquor 1 - Frequency: 3-4 times per week 1 - Duration: Ongoing for years 1 - Last Use / Amount: 02/16/15, two beers plus 1/2 pint liquor Substance #2 Name of Substance 2: Marijuana 2 - Age of First Use: Adolescent 2 - Amount (size/oz): One joint 2 - Frequency: "Not very often 2 - Duration: Onoging for years 2 - Last Use / Amount: Unknown  CIWA: CIWA-Ar BP: 91/55 mmHg Pulse Rate: 71 COWS:    PATIENT STRENGTHS: (choose at least two) Ability for insight Average or above average intelligence Capable of independent living Communication skills General fund of knowledge Religious Affiliation Supportive family/friends  Allergies: No Known Allergies  Home Medications:  (Not in a hospital admission)  OB/GYN Status:  No LMP for male patient.  General Assessment Data Location of Assessment: [facility] TTS Assessment: In system Is this a Tele or Face-to-Face Assessment?: Tele Assessment Is this an Initial Assessment or a Re-assessment for this encounter?: Initial Assessment Marital status: Divorced [place] name: NA Is patient pregnant?: No Pregnancy Status: No Living Arrangements: Alone Can pt return to current living arrangement?: Yes Admission Status: Voluntary Is patient capable of signing voluntary admission?: Yes Referral Source: Self/Family/Friend Insurance type: [employer]     Crisis Care Plan Living Arrangements: Alone Name of Psychiatrist: None Name of Therapist: None  Education Status Is patient  currently in school?: No Current Grade: NA Highest grade of school patient has completed: 12 Name of school: NA Contact person: NA  Risk to self with the past 6 months Suicidal Ideation: No Has patient been a risk to self within the past 6 months prior to admission? : No Suicidal Intent: No Has patient had any suicidal intent within the past 6 months prior to admission? : No Is patient at risk for suicide?: No Suicidal Plan?: No Has patient had any suicidal plan within the past 6 months prior to admission? : No Access to Means: No What has been your use of drugs/alcohol within the last 12 months?: Pt reports regular and heavy alcohol use Previous Attempts/Gestures: No How many times?: 0 Other Self Harm Risks: Pt has fallen while intoxicated Triggers for Past Attempts: None known Intentional Self Injurious Behavior: None Family Suicide History: No Recent stressful life event(s): Legal Issues, Financial Problems Persecutory voices/beliefs?: No Depression: Yes Depression Symptoms: Despondent, Fatigue, Guilt, Feeling angry/irritable Substance abuse history and/or treatment for substance abuse?: Yes Suicide prevention information given to non-admitted patients: Yes  Risk to Others within the past 6 months Homicidal Ideation: No Does patient have any lifetime risk of violence toward others beyond the six months prior to admission? :  No Thoughts of Harm to Others: No Current Homicidal Intent: No Current Homicidal Plan: No Access to Homicidal Means: No Identified Victim: None History of harm to others?: No Assessment of Violence: None Noted Violent Behavior Description: None Does patient have access to weapons?: No Criminal Charges Pending?: Yes Describe Pending Criminal Charges: Trespassing, Intent to sell Oxycodone Does patient have a court date: Yes Court Date: 02/28/15 Is patient on probation?: No  Psychosis Hallucinations: None noted Delusions: None noted  Mental  Status Report Appearance/Hygiene: Disheveled Eye Contact: Fair Motor Activity: Unremarkable Speech: Logical/coherent Level of Consciousness: Alert Mood: Guilty, Depressed Affect: Appropriate to circumstance Anxiety Level: Minimal Thought Processes: Coherent, Relevant Judgement: Partial Orientation: Person, Place, Time, Situation, Appropriate for developmental age Obsessive Compulsive Thoughts/Behaviors: None  Cognitive Functioning Concentration: Normal Memory: Recent Intact, Remote Intact IQ: Average Insight: Fair Impulse Control: Good Appetite: Good Weight Loss: 0 Weight Gain: 0 Sleep: Decreased Total Hours of Sleep: 4 Vegetative Symptoms: None  ADLScreening [facility]) Patient's cognitive ability adequate to safely complete daily activities?: Yes Patient able to express need for assistance with ADLs?: Yes Independently performs ADLs?: Yes (appropriate for developmental age)  Prior Inpatient Therapy Prior Inpatient Therapy: No Prior Therapy Dates: NA Prior Therapy Facilty/Provider(s): NA Reason for Treatment: NA  Prior Outpatient Therapy Prior Outpatient Therapy: No Prior Therapy Dates: NA Prior Therapy Facilty/Provider(s): NA Reason for Treatment: NA Does patient have an ACCT team?: No Does patient have Intensive In-House Services?  : No Does patient have [facility] services? : No Does patient have P4CC services?: No  ADL Screening (condition at time of admission) Patient's cognitive ability adequate to safely complete daily activities?: Yes Is the patient deaf or have difficulty hearing?: No Does the patient have difficulty seeing, even when wearing glasses/contacts?: No Does the patient have difficulty concentrating, remembering, or making decisions?: No Patient able to express need for assistance with ADLs?: Yes Does the patient have difficulty dressing or bathing?: No Independently performs ADLs?: Yes (appropriate for developmental age)        Abuse/Neglect Assessment (Assessment to be complete while patient is alone) Physical Abuse: Denies Verbal Abuse: Denies Sexual Abuse: Denies Exploitation of patient/patient's resources: Denies Self-Neglect: Denies     Merchant navy officer (For Healthcare) Does patient have an advance directive?: No Would patient like information on creating an advanced directive?: No - patient declined information    Additional Information 1:1 In Past 12 Months?: No CIRT Risk: No Elopement Risk: No Does patient have medical clearance?: Yes     Disposition: Gave clinical report to Maryjean Morn, PA who said Pt could benefit from substance abuse treattment but does not appear to meet criteria for inpatient psychiatric treatment, however because Pt's blood alcohol and UDS is not complete and because of relative's concerns for safety he recommends Pt be observed in ED overnight and evaluated by psychiatry in the morning. Discussed this recommendation with Dr. Baxter Hire Ward who agrees with recommendation.  Disposition Initial Assessment Completed for this Encounter: Yes Disposition of Patient: Treatment offered and refused Type of treatment offered and refused: Other (Comment) (Alcohol treatment services)   Harlin Rain Patsy [place], [facility], [facility], [facility] Triage Specialist [phone]   Pamalee Leyden 02/17/2015 1:32 AM

## 2015-02-17 NOTE — ED Notes (Signed)
Patient ambulatory to and from restroom.

## 2015-02-17 NOTE — BH Assessment (Signed)
Received notification of TTS consult request. Spoke to Dr. Rochele Raring, DO who said Pt is abusing alcohol and reporting passive suicidal ideation. Tele-assessment will be initiated.  Harlin Rain Patsy [place], LPC, [facility], [facility] Triage Specialist [phone]

## 2015-02-17 NOTE — Discharge Instructions (Signed)
Alcohol Intoxication Alcohol intoxication occurs when the amount of alcohol that a person has consumed impairs his or her ability to mentally and physically function. Alcohol directly impairs the normal chemical activity of the brain. Drinking large amounts of alcohol can lead to changes in mental function and behavior, and it can cause many physical effects that can be harmful.  Alcohol intoxication can range in severity from mild to very severe. Various factors can affect the level of intoxication that occurs, such as the person's age, gender, weight, frequency of alcohol consumption, and the presence of other medical conditions (such as diabetes, seizures, or heart conditions). Dangerous levels of alcohol intoxication may occur when people drink large amounts of alcohol in a short period (binge drinking). Alcohol can also be especially dangerous when combined with certain prescription medicines or "recreational" drugs. SIGNS AND SYMPTOMS Some common signs and symptoms of mild alcohol intoxication include:  Loss of coordination.  Changes in mood and behavior.  Impaired judgment.  Slurred speech. As alcohol intoxication progresses to more severe levels, other signs and symptoms will appear. These may include:  Vomiting.  Confusion and impaired memory.  Slowed breathing.  Seizures.  Loss of consciousness. DIAGNOSIS  Your health care provider will take a medical history and perform a physical exam. You will be asked about the amount and type of alcohol you have consumed. Blood tests will be done to measure the concentration of alcohol in your blood. In many places, your blood alcohol level must be lower than 80 mg/dL (7.82%) to legally drive. However, many dangerous effects of alcohol can occur at much lower levels.  TREATMENT  People with alcohol intoxication often do not require treatment. Most of the effects of alcohol intoxication are temporary, and they go away as the alcohol naturally  leaves the body. Your health care provider will monitor your condition until you are stable enough to go home. Fluids are sometimes given through an IV access tube to help prevent dehydration.  HOME CARE INSTRUCTIONS  Do not drive after drinking alcohol.  Stay hydrated. Drink enough water and fluids to keep your urine clear or pale yellow. Avoid caffeine.   Only take over-the-counter or prescription medicines as directed by your health care provider.  SEEK MEDICAL CARE IF:   You have persistent vomiting.   You do not feel better after a few days.  You have frequent alcohol intoxication. Your health care provider can help determine if you should see a substance use treatment counselor. SEEK IMMEDIATE MEDICAL CARE IF:   You become shaky or tremble when you try to stop drinking.   You shake uncontrollably (seizure).   You throw up (vomit) blood. This may be bright red or may look like black coffee grounds.   You have blood in your stool. This may be bright red or may appear as a black, tarry, bad smelling stool.   You become lightheaded or faint.  MAKE SURE YOU:   Understand these instructions.  Will watch your condition.  Will get help right away if you are not doing well or get worse. Document Released: 04/09/2005 Document Revised: 03/02/2013 Document Reviewed: 12/03/2012 [facility] Patient Information 2015 [place], [place]. This information is not intended to replace advice given to you by your health care provider. Make sure you discuss any questions you have with your health care provider.  Depression Depression refers to feeling sad, low, down in the dumps, blue, gloomy, or empty. In general, there are two kinds of depression:  Normal sadness  or normal grief. This kind of depression is one that we all feel from time to time after upsetting life experiences, such as the loss of a job or the ending of a relationship. This kind of depression is considered normal, is short  lived, and resolves within a few days to 2 weeks. Depression experienced after the loss of a loved one (bereavement) often lasts longer than 2 weeks but normally gets better with time.  Clinical depression. This kind of depression lasts longer than normal sadness or normal grief or interferes with your ability to function at home, at work, and in school. It also interferes with your personal relationships. It affects almost every aspect of your life. Clinical depression is an illness. Symptoms of depression can also be caused by conditions other than those mentioned above, such as:  Physical illness. Some physical illnesses, including underactive thyroid gland (hypothyroidism), severe anemia, specific types of cancer, diabetes, uncontrolled seizures, heart and lung problems, strokes, and chronic pain are commonly associated with symptoms of depression.  Side effects of some prescription medicine. In some people, certain types of medicine can cause symptoms of depression.  Substance abuse. Abuse of alcohol and illicit drugs can cause symptoms of depression. SYMPTOMS Symptoms of normal sadness and normal grief include the following:  Feeling sad or crying for short periods of time.  Not caring about anything (apathy).  Difficulty sleeping or sleeping too much.  No longer able to enjoy the things you used to enjoy.  Desire to be by oneself all the time (social isolation).  Lack of energy or motivation.  Difficulty concentrating or remembering.  Change in appetite or weight.  Restlessness or agitation. Symptoms of clinical depression include the same symptoms of normal sadness or normal grief and also the following symptoms:  Feeling sad or crying all the time.  Feelings of guilt or worthlessness.  Feelings of hopelessness or helplessness.  Thoughts of suicide or the desire to harm yourself (suicidal ideation).  Loss of touch with reality (psychotic symptoms). Seeing or hearing  things that are not real (hallucinations) or having false beliefs about your life or the people around you (delusions and paranoia). DIAGNOSIS  The diagnosis of clinical depression is usually based on how bad the symptoms are and how long they have lasted. Your health care provider will also ask you questions about your medical history and substance use to find out if physical illness, use of prescription medicine, or substance abuse is causing your depression. Your health care provider may also order blood tests. TREATMENT  Often, normal sadness and normal grief do not require treatment. However, sometimes antidepressant medicine is given for bereavement to ease the depressive symptoms until they resolve. The treatment for clinical depression depends on how bad the symptoms are but often includes antidepressant medicine, counseling with a mental health professional, or both. Your health care provider will help to determine what treatment is best for you. Depression caused by physical illness usually goes away with appropriate medical treatment of the illness. If prescription medicine is causing depression, talk with your health care provider about stopping the medicine, decreasing the dose, or changing to another medicine. Depression caused by the abuse of alcohol or illicit drugs goes away when you stop using these substances. Some adults need professional help in order to stop drinking or using drugs. SEEK IMMEDIATE MEDICAL CARE IF:  You have thoughts about hurting yourself or others.  You lose touch with reality (have psychotic symptoms).  You are taking medicine  for depression and have a serious side effect. FOR MORE INFORMATION  National Alliance on Mental Illness: www.nami.[employer] of Mental Health: http://www.maynard.net/ Document Released: 06/27/2000 Document Revised: 11/14/2013 Document Reviewed: 09/29/2011 [facility] Patient Information 2015 [place], [place]. This information  is not intended to replace advice given to you by your health care provider. Make sure you discuss any questions you have with your health care provider.  Rib Fracture A rib fracture is a break or crack in one of the bones of the ribs. The ribs are a group of long, curved bones that wrap around your chest and attach to your spine. They protect your lungs and other organs in the chest cavity. A broken or cracked rib is often painful, but most do not cause other problems. Most rib fractures heal on their own over time. However, rib fractures can be more serious if multiple ribs are broken or if broken ribs move out of place and push against other structures. CAUSES   A direct blow to the chest. For example, this could happen during contact sports, a car accident, or a fall against a hard object.  Repetitive movements with high force, such as pitching a baseball or having severe coughing spells. SYMPTOMS   Pain when you breathe in or cough.  Pain when someone presses on the injured area. DIAGNOSIS  Your caregiver will perform a physical exam. Various imaging tests may be ordered to confirm the diagnosis and to look for related injuries. These tests may include a chest X-ray, computed tomography (CT), magnetic resonance imaging (MRI), or a bone scan. TREATMENT  Rib fractures usually heal on their own in 1-3 months. The longer healing period is often associated with a continued cough or other aggravating activities. During the healing period, pain control is very important. Medication is usually given to control pain. Hospitalization or surgery may be needed for more severe injuries, such as those in which multiple ribs are broken or the ribs have moved out of place.  HOME CARE INSTRUCTIONS   Avoid strenuous activity and any activities or movements that cause pain. Be careful during activities and avoid bumping the injured rib.  Gradually increase activity as directed by your caregiver.  Only take  over-the-counter or prescription medications as directed by your caregiver. Do not take other medications without asking your caregiver first.  Apply ice to the injured area for the first 1-2 days after you have been treated or as directed by your caregiver. Applying ice helps to reduce inflammation and pain.  Put ice in a plastic bag.  Place a towel between your skin and the bag.   Leave the ice on for 15-20 minutes at a time, every 2 hours while you are awake.  Perform deep breathing as directed by your caregiver. This will help prevent pneumonia, which is a common complication of a broken rib. Your caregiver may instruct you to:  Take deep breaths several times a day.  Try to cough several times a day, holding a pillow against the injured area.  Use a device called an incentive spirometer to practice deep breathing several times a day.  Drink enough fluids to keep your urine clear or pale yellow. This will help you avoid constipation.   Do not wear a rib belt or binder. These restrict breathing, which can lead to pneumonia.  SEEK IMMEDIATE MEDICAL CARE IF:   You have a fever.   You have difficulty breathing or shortness of breath.   You develop a  continual cough, or you cough up thick or bloody sputum.  You feel sick to your stomach (nausea), throw up (vomit), or have abdominal pain.   You have worsening pain not controlled with medications.  MAKE SURE YOU:  Understand these instructions.  Will watch your condition.  Will get help right away if you are not doing well or get worse. Document Released: 06/30/2005 Document Revised: 03/02/2013 Document Reviewed: 09/01/2012 [facility] Patient Information 2015 [place], [place]. This information is not intended to replace advice given to you by your health care provider. Make sure you discuss any questions you have with your health care provider.

## 2015-02-17 NOTE — Consult Note (Signed)
Telepsych Consultation   Reason for Consult:  Alcohol intoxication Referring Physician:  EDP Patient Identification: Dwayne Anthony MRN:  [id number] Principal Diagnosis: Alcohol dependence with uncomplicated withdrawal Diagnosis:   Patient Active Problem List   Diagnosis Date Noted  . Alcohol dependence with uncomplicated withdrawal [Q59.563] 02/17/2015    Priority: High  . Metacarpal bone fracture [S62.309A] 03/18/2013    Total Time spent with patient: 45 minutes  Subjective:   Dwayne Anthony is a 59 y.o. male patient does not require admission.  HPI:  The patient was drinking yesterday and fell. He came to the ED and was diagnosed with rib fractures.  His BAL was 192, positive for marijuana, and benzodiazepines.  Dwayne Anthony reports drinking 8-10 beers daily and marijuana occasionally.  He denies suicidal ideations and past suicide attempts, no psychiatric admissions.  No homicidal ideations or hallucinations.  Denies withdrawal seizures.  He does not want rehab or substance abuse treatment.  "I feel fine, wore out last night, no intent to hurt myself or anybody."  Stable for discharge. HPI Elements:   Location:  generalized. Quality:  chronic. Severity:  mild to moderate. Timing:  daily. Duration:  intermittent. Context:  stressors.  Past Medical History:  Past Medical History  Diagnosis Date  . Hypertension     Past Surgical History  Procedure Laterality Date  . Knee surgery Left   . Eye surgery    . Appendectomy    . Chest surgery to repair laceration     Family History:  Family History  Problem Relation Age of Onset  . Stroke Mother    Social History:  History  Alcohol Use  . 14.4 oz/week  . 24 Cans of beer per week    Comment: 1 pint per day     History  Drug Use  . Yes  . Special: Marijuana    Comment: 1 week ago    History   Social History  . Marital Status: Divorced    Spouse Name: N/A  . Number of Children: N/A  . Years of Education: N/A    Social History Main Topics  . Smoking status: Former Research scientist (life sciences)  . Smokeless tobacco: Never Used  . Alcohol Use: 14.4 oz/week    24 Cans of beer per week     Comment: 1 pint per day  . Drug Use: Yes    Special: Marijuana     Comment: 1 week ago  . Sexual Activity: Not on file   Other Topics Concern  . None   Social History Narrative   Additional Social History:    Pain Medications: Denies abuse Prescriptions: Denies abuse Over the Counter: Uses Unisom and Tylenol PM for sleep History of alcohol / drug use?: Yes Longest period of sobriety (when/how long): Unknown Negative Consequences of Use: Legal Withdrawal Symptoms:  (Denies) Name of Substance 1: Alcohol 1 - Age of First Use: Adolescent 1 - Amount (size/oz): Approximately six beer plus one pint liquor 1 - Frequency: 3-4 times per week 1 - Duration: Ongoing for years 1 - Last Use / Amount: 02/16/15, two beers plus 1/2 pint liquor Name of Substance 2: Marijuana 2 - Age of First Use: Adolescent 2 - Amount (size/oz): One joint 2 - Frequency: "Not very often 2 - Duration: Onoging for years 2 - Last Use / Amount: Unknown                 Allergies:  No Known Allergies  Labs:  Results for orders placed or  performed during the hospital encounter of 02/16/15 (from the past 48 hour(s))  CBC WITH DIFFERENTIAL     Status: Abnormal   Collection Time: 02/17/15  1:25 AM  Result Value Ref Range   WBC 9.9 4.0 - 10.5 K/uL   RBC 4.47 4.22 - 5.81 MIL/uL   Hemoglobin 13.8 13.0 - 17.0 g/dL   HCT 40.6 39.0 - 52.0 %   MCV 90.8 78.0 - 100.0 fL   MCH 30.9 26.0 - 34.0 pg   MCHC 34.0 30.0 - 36.0 g/dL   RDW 13.0 11.5 - 15.5 %   Platelets 263 150 - 400 K/uL   Neutrophils Relative % 84 (H) 43 - 77 %   Neutro Abs 8.3 (H) 1.7 - 7.7 K/uL   Lymphocytes Relative 11 (L) 12 - 46 %   Lymphs Abs 1.1 0.7 - 4.0 K/uL   Monocytes Relative 5 3 - 12 %   Monocytes Absolute 0.5 0.1 - 1.0 K/uL   Eosinophils Relative 0 0 - 5 %   Eosinophils  Absolute 0.0 0.0 - 0.7 K/uL   Basophils Relative 0 0 - 1 %   Basophils Absolute 0.0 0.0 - 0.1 K/uL  Comprehensive metabolic panel     Status: Abnormal   Collection Time: 02/17/15  1:25 AM  Result Value Ref Range   Sodium 132 (L) 135 - 145 mmol/L   Potassium 4.4 3.5 - 5.1 mmol/L   Chloride 97 (L) 101 - 111 mmol/L   CO2 24 22 - 32 mmol/L   Glucose, Bld 102 (H) 65 - 99 mg/dL   BUN 12 6 - 20 mg/dL   Creatinine, Ser 0.73 0.61 - 1.24 mg/dL   Calcium 8.9 8.9 - 10.3 mg/dL   Total Protein 7.2 6.5 - 8.1 g/dL   Albumin 4.6 3.5 - 5.0 g/dL   AST 37 15 - 41 U/L   ALT 38 17 - 63 U/L   Alkaline Phosphatase 50 38 - 126 U/L   Total Bilirubin 0.5 0.3 - 1.2 mg/dL   GFR calc non Af Amer >60 >60 mL/min   GFR calc Af Amer >60 >60 mL/min    Comment: (NOTE) The eGFR has been calculated using the CKD EPI equation. This calculation has not been validated in all clinical situations. eGFR's persistently <60 mL/min signify possible Chronic Kidney Disease.    Anion gap 11 5 - 15  Ethanol     Status: Abnormal   Collection Time: 02/17/15  1:25 AM  Result Value Ref Range   Alcohol, Ethyl (B) 192 (H) <5 mg/dL    Comment:        LOWEST DETECTABLE LIMIT FOR SERUM ALCOHOL IS 5 mg/dL FOR MEDICAL PURPOSES ONLY   Urine rapid drug screen (hosp performed)not at [facility]     Status: Abnormal   Collection Time: 02/17/15  2:10 AM  Result Value Ref Range   Opiates NONE DETECTED NONE DETECTED   Cocaine NONE DETECTED NONE DETECTED   Benzodiazepines POSITIVE (A) NONE DETECTED   Amphetamines NONE DETECTED NONE DETECTED   Tetrahydrocannabinol POSITIVE (A) NONE DETECTED   Barbiturates NONE DETECTED NONE DETECTED    Comment:        DRUG SCREEN FOR MEDICAL PURPOSES ONLY.  IF CONFIRMATION IS NEEDED FOR ANY PURPOSE, NOTIFY LAB WITHIN 5 DAYS.        LOWEST DETECTABLE LIMITS FOR URINE DRUG SCREEN Drug Class       Cutoff (ng/mL) Amphetamine      1000 Barbiturate      200  Benzodiazepine   629 Tricyclics       528 Opiates           300 Cocaine          300 THC              50     Vitals: Blood pressure 123/61, pulse 60, temperature 97.7 F (36.5 C), temperature source Oral, resp. rate 16, height _0  (1.854 m), weight 99.791 kg (220 lb), SpO2 100 %.  Risk to Self: Suicidal Ideation: No Suicidal Intent: No Is patient at risk for suicide?: No Suicidal Plan?: No Access to Means: No What has been your use of drugs/alcohol within the last 12 months?: Pt reports regular and heavy alcohol use How many times?: 0 Other Self Harm Risks: Pt has fallen while intoxicated Triggers for Past Attempts: None known Intentional Self Injurious Behavior: None Risk to Others: Homicidal Ideation: No Thoughts of Harm to Others: No Current Homicidal Intent: No Current Homicidal Plan: No Access to Homicidal Means: No Identified Victim: None History of harm to others?: No Assessment of Violence: None Noted Violent Behavior Description: None Does patient have access to weapons?: No Criminal Charges Pending?: Yes Describe Pending Criminal Charges: Trespassing, Intent to sell Oxycodone Does patient have a court date: Yes Court Date: 02/28/15 Prior Inpatient Therapy: Prior Inpatient Therapy: No Prior Therapy Dates: NA Prior Therapy Facilty/Provider(s): NA Reason for Treatment: NA Prior Outpatient Therapy: Prior Outpatient Therapy: No Prior Therapy Dates: NA Prior Therapy Facilty/Provider(s): NA Reason for Treatment: NA Does patient have an ACCT team?: No Does patient have Intensive In-House Services?  : No Does patient have [facility] services? : No Does patient have [facility] services?: No  Current Facility-Administered Medications  Medication Dose Route Frequency Provider Last Rate Last Dose  . amLODipine (NORVASC) tablet 10 mg  10 mg Oral Daily Kristen N Ward, DO      . aspirin EC tablet 81 mg  81 mg Oral Daily Kristen N Ward, DO      . atorvastatin (LIPITOR) tablet 40 mg  40 mg Oral Daily Kristen N Ward, DO      .  enalapril (VASOTEC) tablet 20 mg  20 mg Oral Daily Kristen N Ward, DO      . escitalopram (LEXAPRO) tablet 10 mg  10 mg Oral Daily Kristen N Ward, DO      . ibuprofen (ADVIL,MOTRIN) tablet 800 mg  800 mg Oral Q8H PRN Kristen N Ward, DO      . omega-3 acid ethyl esters (LOVAZA) capsule 1 g  1 g Oral Daily Kristen N Ward, DO      . ondansetron (ZOFRAN-ODT) disintegrating tablet 4 mg  4 mg Oral Q8H PRN Kristen N Ward, DO      . oxyCODONE-acetaminophen (PERCOCET/ROXICET) 5-325 MG per tablet 2 tablet  2 tablet Oral Q6H PRN Kristen N Ward, DO      . thiamine (VITAMIN B-1) tablet 100 mg  100 mg Oral Daily Kristen N Ward, DO       Current Outpatient Prescriptions  Medication Sig Dispense Refill  . amLODipine (NORVASC) 10 MG tablet Take 10 mg by mouth daily.    Marland Kitchen aspirin EC 81 MG tablet Take 81 mg by mouth daily.    Marland Kitchen atorvastatin (LIPITOR) 40 MG tablet Take 40 mg by mouth daily.    . enalapril (VASOTEC) 20 MG tablet Take 20 mg by mouth daily.    Marland Kitchen escitalopram (LEXAPRO) 10 MG tablet Take 10 mg by mouth daily.    Marland Kitchen  fish oil-omega-3 fatty acids 1000 MG capsule Take 1 g by mouth daily.    . ondansetron (ZOFRAN ODT) 4 MG disintegrating tablet Take 1 tablet (4 mg total) by mouth every 8 (eight) hours as needed for nausea. 10 tablet 0  . oxyCODONE (OXYCONTIN) 10 MG 12 hr tablet Take 10 mg by mouth every 12 (twelve) hours as needed for pain.    . promethazine (PHENERGAN) 25 MG tablet Take 1 tablet (25 mg total) by mouth every 6 (six) hours as needed for nausea. 12 tablet 0    Musculoskeletal: Strength & Muscle Tone: within normal limits Gait & Station: normal Patient leans: N/A  Psychiatric Specialty Exam: Physical Exam  Review of Systems  Constitutional: Negative.   HENT: Negative.   Eyes: Negative.   Respiratory: Negative.   Cardiovascular: Negative.   Gastrointestinal: Negative.   Genitourinary: Negative.   Musculoskeletal:       Rib pain from fx ribs  Skin: Negative.   Neurological:  Negative.   Endo/Heme/Allergies: Negative.   Psychiatric/Behavioral: Positive for substance abuse.    Blood pressure 123/61, pulse 60, temperature 97.7 F (36.5 C), temperature source Oral, resp. rate 16, height _0  (1.854 m), weight 99.791 kg (220 lb), SpO2 100 %.Body mass index is 29.03 kg/(m^2).  General Appearance: Disheveled  Eye Contact::  Good  Speech:  Normal Rate  Volume:  Normal  Mood:  Euthymic  Affect:  Congruent  Thought Process:  Coherent  Orientation:  Full (Time, Place, and Person)  Thought Content:  WDL  Suicidal Thoughts:  No  Homicidal Thoughts:  No  Memory:  Immediate;   Good Recent;   Good Remote;   Good  Judgement:  Fair  Insight:  Fair  Psychomotor Activity:  Decreased  Concentration:  Good  Recall:  Good  Fund of Knowledge:Fair  Language: Good  Akathisia:  No  Handed:  Right  AIMS (if indicated):     Assets:  Housing Leisure Time Physical Health Resilience Social Support  ADL's:  Intact  Cognition: WNL  Sleep:      Medical Decision Making: Review of Psycho-Social Stressors (1), Review or order clinical lab tests (1) and Review of Medication Regimen & Side Effects (2)   Treatment Plan Summary: Daily contact with patient to assess and evaluate symptoms and progress in treatment, Medication management and Plan :  Alcohol dependence with uncomplicated withdrawal:  Ativan PRN given for alcohol withdrawal, resources provided for substance abuse treatment and AA groups.  Plan:  No evidence of imminent risk to self or others at present.   Disposition: Discharge with substance abuse resources  Waylan Boga, Golf 02/17/2015 9:31 AM  Patient seen face-to-face for psychiatric evaluation, chart reviewed and case discussed with the physician extender and developed treatment plan. Reviewed the information documented and agree with the treatment plan. Corena Pilgrim, MD

## 2015-02-17 NOTE — ED Notes (Signed)
Pt made aware to return if symptoms worsen or if any life threatening symptoms occur.   

## 2015-02-17 NOTE — ED Provider Notes (Signed)
Patient cleared by behavioral health for discharge home with follow-up for outpatient substance abuse mainly alcoholism problems. Patient does have rib fractures so will be given some hydrocodone and an incentive spirometer.  Vanetta Mulders, MD 02/17/15 1247

## 2015-02-17 NOTE — ED Provider Notes (Addendum)
TIME SEEN: This chart was scribed for Dwayne Maw Siddhi Dornbush, DO by Murriel Hopper, ED Scribe. This patient was seen in room APA12/[facility]12 and the patient's care was started at 12:05 AM.   CHIEF COMPLAINT:  Chief Complaint  Patient presents with  . Chest Pain     HPI: HPI Comments: Dwayne Anthony is a 59 y.o. male with history of hypertension and alcohol abuse who presents to the Emergency Department complaining of constant left-sided rib pain that has been present since earlier today when pt was intoxicated and slipped and landed on some rocks on the left side of his chest. Pt states that movement and taking deep breaths worsens the pain. Pt states he does not remember if he hit his head during the fall. Pt reports frequently consuming alcohol. Is not on anticoagulation. Denies any other pain other than left chest wall pain. States it hurts to take a deep breath.  ROS: See HPI Constitutional: no fever  Eyes: no drainage  ENT: no runny nose   Cardiovascular:  no chest pain  Resp: no SOB  GI: no vomiting GU: no dysuria Integumentary: no rash  Allergy: no hives  Musculoskeletal: no leg swelling  Neurological: no slurred speech ROS otherwise negative  PAST MEDICAL HISTORY/PAST SURGICAL HISTORY:  Past Medical History  Diagnosis Date  . Hypertension     MEDICATIONS:  Prior to Admission medications   Medication Sig Start Date End Date Taking? Authorizing Provider  amLODipine (NORVASC) 10 MG tablet Take 10 mg by mouth daily.    Historical Provider, MD  aspirin EC 81 MG tablet Take 81 mg by mouth daily.    Historical Provider, MD  atorvastatin (LIPITOR) 40 MG tablet Take 40 mg by mouth daily.    Historical Provider, MD  enalapril (VASOTEC) 20 MG tablet Take 20 mg by mouth daily.    Historical Provider, MD  escitalopram (LEXAPRO) 10 MG tablet Take 10 mg by mouth daily.    Historical Provider, MD  fish oil-omega-3 fatty acids 1000 MG capsule Take 1 g by mouth daily.    Historical Provider, MD   ondansetron (ZOFRAN ODT) 4 MG disintegrating tablet Take 1 tablet (4 mg total) by mouth every 8 (eight) hours as needed for nausea. 11/25/12   Vanetta Mulders, MD  oxyCODONE (OXYCONTIN) 10 MG 12 hr tablet Take 10 mg by mouth every 12 (twelve) hours as needed for pain.    Historical Provider, MD  promethazine (PHENERGAN) 25 MG tablet Take 1 tablet (25 mg total) by mouth every 6 (six) hours as needed for nausea. 11/25/12   Vanetta Mulders, MD    ALLERGIES:  No Known Allergies  SOCIAL HISTORY:  History  Substance Use Topics  . Smoking status: Former Games developer  . Smokeless tobacco: Never Used  . Alcohol Use: 14.4 oz/week    24 Cans of beer per week     Comment: 1 pint per day    FAMILY HISTORY: Family History  Problem Relation Age of Onset  . Stroke Mother     EXAM: BP 91/55 mmHg  Pulse 71  Temp(Src) 97.7 F (36.5 C) (Oral)  Resp 20  Ht  (1.854 m)  Wt 220 lb (99.791 kg)  BMI 29.03 kg/m2  SpO2 100% CONSTITUTIONAL: Alert and oriented and responds appropriately to questions. Disheveled, smells of alcohol, GCS 15, appears uncomfortable HEAD: Normocephalic; small abrasion to the left cheek EYES: Conjunctivae clear, PERRL, EOMI ENT: normal nose; no rhinorrhea; moist mucous membranes; pharynx without lesions noted; no dental injury;  no septal hematoma NECK: Supple, no meningismus, no LAD; no midline spinal tenderness, step-off or deformity CARD: RRR; S1 and S2 appreciated; no murmurs, no clicks, no rubs, no gallops RESP: Normal chest excursion without splinting or tachypnea; breath sounds clear and equal bilaterally; no wheezes, no rhonchi, no rales; chest wall stable, tender to palpation over the left chest wall without crepitus or ecchymosis or deformity ABD/GI: Normal bowel sounds; non-distended; soft, non-tender, no rebound, no guarding PELVIS:  stable, nontender to palpation BACK:  The back appears normal and is non-tender to palpation, there is no CVA tenderness; no midline  spinal tenderness, step-off or deformity EXT: Normal ROM in all joints; non-tender to palpation; no edema; normal capillary refill; no cyanosis    SKIN: Normal color for age and race; warm NEURO: Moves all extremities equally, sensation to light touch intact diffusely, cranial nerves II through XII intact PSYCH: The patient's mood and manner are appropriate. Patient appears disheveled, smells of alcohol.  MEDICAL DECISION MAKING: Patient here with fall. Reports that he lost his balance because he was intoxicated and fell on a rock on his left chest wall. Denies hitting his head but does have a small abrasion to the left cheek. Given he appears intoxicated we'll obtain CT of his head and cervical spine as well as left rib series. We'll give 1 tablet of Vicodin as he does seem to be uncomfortable. He is hemodynamically stable.  ED PROGRESS: 1:00 AM  Pt's niece and nephew are at the bedside. They state they are worried about the patient as he has been endorsing suicidal thoughts. He admits to feeling like "I don't want to live anymore" and "I just want God to take me". No active plan. No prior history of suicide attempt. No HI or hallucinations. Because of his passive suicidality, will consult TTS and obtain screening labs and urine.   1:50 AM  Ford with behavioral health has seen the patient and talked to the patient's family. Patient is now adamantly denying suicidal thoughts but I suspect this is because he does not want to stay in the emergency department. Patient's niece does not feel he is safe to be discharged home. Plan is for psychiatry to reevaluate the patient in the morning. At this time he agrees to stay voluntarily. CT of his head and neck show no acute injury. He does have left sixth and seventh rib fractures without pneumothorax. If discharged, patient will need to be discharged with incentive spirometry.   4:00 AM  Labs unremarkable other than alcohol level of the 192 at 125am. Urine drug  skin positive for benzodiazepine's and THC. Patient is medically stable and awaiting TTS disposition.    I personally performed the services described in this documentation, which was scribed in my presence. The recorded information has been reviewed and is accurate.     Dwayne Maw Letoya Stallone, DO 02/17/15 0356  Dwayne Maw Almira Phetteplace, DO 02/17/15 [phone]

## 2015-02-17 NOTE — ED Notes (Signed)
Meal tray given 

## 2015-02-17 NOTE — ED Notes (Signed)
Patient resting, eyes closed, even rise and fall of chest NAD noted

## 2015-02-17 NOTE — ED Notes (Signed)
Patient placed in paper scrubs at this time. Patients belongings secured in locker

## 2015-02-17 NOTE — ED Notes (Signed)
Patient ambulatory to and from restroom to collect urine sample.   Patient provided meal tray at request.

## 2015-02-17 NOTE — ED Notes (Signed)
Patient remains asleep. Will assess and obtain vital signs upon awakening. No distress, respirations even and unlabored.

## 2015-02-17 NOTE — ED Notes (Signed)
TTS complete at this time 

## 2015-05-24 ENCOUNTER — Emergency Department (HOSPITAL_COMMUNITY)
Admission: EM | Admit: 2015-05-24 | Discharge: 2015-05-24 | Disposition: A | Discharge status: Home / Self Care | Payer: Medicare PPO | Attending: Emergency Medicine | Admitting: Emergency Medicine

## 2015-05-24 ENCOUNTER — Encounter (HOSPITAL_COMMUNITY): Payer: Self-pay

## 2015-05-24 DIAGNOSIS — W57XXXA Bitten or stung by nonvenomous insect and other nonvenomous arthropods, initial encounter: Secondary | ICD-10-CM | POA: Insufficient documentation

## 2015-05-24 DIAGNOSIS — T148 Other injury of unspecified body region: Secondary | ICD-10-CM | POA: Diagnosis not present

## 2015-05-24 DIAGNOSIS — R21 Rash and other nonspecific skin eruption: Secondary | ICD-10-CM | POA: Diagnosis present

## 2015-05-24 DIAGNOSIS — Z79899 Other long term (current) drug therapy: Secondary | ICD-10-CM | POA: Diagnosis not present

## 2015-05-24 DIAGNOSIS — Z7982 Long term (current) use of aspirin: Secondary | ICD-10-CM | POA: Insufficient documentation

## 2015-05-24 DIAGNOSIS — Y9259 Other trade areas as the place of occurrence of the external cause: Secondary | ICD-10-CM | POA: Diagnosis not present

## 2015-05-24 DIAGNOSIS — Z87891 Personal history of nicotine dependence: Secondary | ICD-10-CM | POA: Diagnosis not present

## 2015-05-24 DIAGNOSIS — I10 Essential (primary) hypertension: Secondary | ICD-10-CM | POA: Insufficient documentation

## 2015-05-24 DIAGNOSIS — Y998 Other external cause status: Secondary | ICD-10-CM | POA: Insufficient documentation

## 2015-05-24 DIAGNOSIS — Y9389 Activity, other specified: Secondary | ICD-10-CM | POA: Insufficient documentation

## 2015-05-24 MED ORDER — HYDROXYZINE HCL 25 MG PO TABS
25.0000 mg | ORAL_TABLET | Freq: Once | ORAL | Status: AC
  Administered 2015-05-24: 25 mg via ORAL
  Filled 2015-05-24: qty 1

## 2015-05-24 MED ORDER — DEXAMETHASONE SODIUM PHOSPHATE 4 MG/ML IJ SOLN
10.0000 mg | Freq: Once | INTRAMUSCULAR | Status: AC
  Administered 2015-05-24: 10 mg via INTRAMUSCULAR

## 2015-05-24 MED ORDER — PREDNISONE 10 MG PO TABS: ORAL_TABLET | ORAL | Status: AC

## 2015-05-24 MED ORDER — HYDROXYZINE HCL 25 MG PO TABS: 25.0000 mg | ORAL_TABLET | Freq: Four times a day (QID) | ORAL | Status: AC

## 2015-05-24 MED ORDER — DEXAMETHASONE SODIUM PHOSPHATE 4 MG/ML IJ SOLN
10.0000 mg | Freq: Once | INTRAMUSCULAR | Status: DC
  Filled 2015-05-24: qty 3

## 2015-05-24 NOTE — ED Notes (Signed)
Pt reports brooke out in rash from bed bugs he got from [place] in [place].

## 2015-05-25 NOTE — ED Provider Notes (Signed)
CSN: [id number]     Arrival date & time 05/24/15  1137 History   First MD Initiated Contact with Patient 05/24/15 1151     Chief Complaint  Patient presents with  . Rash     (Consider location/radiation/quality/duration/timing/severity/associated sxs/prior Treatment) HPI  Dwayne Anthony is a 59 y.o. male who presents to the Emergency Department complaining of multiple insect bites to most of his body.  He states that he recently stayed in a "cheap hotel" in [facility] and woke up two days ago with multiple "red bumps" with severe itching.  He went to a urgent care there and diagnosed with bed bug bites and was given a shot and told to use OTC anti-itch cream which he states has not helped his symptoms.  He states that he threw away all his clothes before returning home and did not bring any belongings home.  He denies swelling, fever, chills, difficulty swallowing or breathing   Past Medical History  Diagnosis Date  . Hypertension    Past Surgical History  Procedure Laterality Date  . Knee surgery Left   . Eye surgery    . Appendectomy    . Chest surgery to repair laceration     Family History  Problem Relation Age of Onset  . Stroke Mother    Social History  Substance Use Topics  . Smoking status: Former Games developer  . Smokeless tobacco: Never Used  . Alcohol Use: 14.4 oz/week    24 Cans of beer per week     Comment: 1 pint per day    Review of Systems  Constitutional: Negative for fever, chills, activity change and appetite change.  HENT: Negative for facial swelling, sore throat and trouble swallowing.   Respiratory: Negative for chest tightness, shortness of breath and wheezing.   Musculoskeletal: Negative for neck pain and neck stiffness.  Skin: Positive for rash. Negative for wound.  Neurological: Negative for dizziness, weakness, numbness and headaches.  All other systems reviewed and are negative.     Allergies  Review of patient's allergies indicates no  known allergies.  Home Medications   Prior to Admission medications   Medication Sig Start Date End Date Taking? Authorizing Provider  amLODipine (NORVASC) 10 MG tablet Take 10 mg by mouth daily.    Historical Provider, MD  aspirin EC 81 MG tablet Take 81 mg by mouth daily.    Historical Provider, MD  atorvastatin (LIPITOR) 40 MG tablet Take 40 mg by mouth daily.    Historical Provider, MD  enalapril (VASOTEC) 20 MG tablet Take 20 mg by mouth daily.    Historical Provider, MD  escitalopram (LEXAPRO) 10 MG tablet Take 10 mg by mouth daily.    Historical Provider, MD  fish oil-omega-3 fatty acids 1000 MG capsule Take 1 g by mouth daily.    Historical Provider, MD  HYDROcodone-acetaminophen (NORCO/VICODIN) 5-325 MG per tablet Take 1-2 tablets by mouth every 6 (six) hours as needed. 02/17/15   Vanetta Mulders, MD  hydrOXYzine (ATARAX/VISTARIL) 25 MG tablet Take 1 tablet (25 mg total) by mouth every 6 (six) hours. As needed for itching 05/24/15   Jeilyn Reznik, PA-C  LORazepam (ATIVAN) 1 MG tablet Take 1 mg by mouth 3 (three) times daily. 01/25/15   Historical Provider, MD  oxyCODONE (OXYCONTIN) 10 MG 12 hr tablet Take 10 mg by mouth every 12 (twelve) hours as needed for pain.    Historical Provider, MD  predniSONE (DELTASONE) 10 MG tablet Take 6 tablets day one, 5  tablets day two, 4 tablets day three, 3 tablets day four, 2 tablets day five, then 1 tablet day six 05/24/15   Nkosi Cortright, PA-C  promethazine (PHENERGAN) 25 MG tablet Take 1 tablet (25 mg total) by mouth every 6 (six) hours as needed for nausea. Patient not taking: Reported on 02/17/2015 11/25/12   Vanetta MuldersScott Zackowski, MD   BP 151/96 mmHg  Pulse 93  Temp(Src) 98 F (36.7 C) (Oral)  Resp 20  SpO2 99% Physical Exam  Constitutional: He is oriented to person, place, and time. He appears well-developed and well-nourished. No distress.  HENT:  Head: Normocephalic and atraumatic.  Mouth/Throat: Oropharynx is clear and moist.  Neck: Normal  range of motion. Neck supple.  Cardiovascular: Normal rate, regular rhythm and intact distal pulses.   No murmur heard. Pulmonary/Chest: Effort normal and breath sounds normal. No respiratory distress.  Musculoskeletal: He exhibits no edema or tenderness.  Lymphadenopathy:    He has no cervical adenopathy.  Neurological: He is alert and oriented to person, place, and time. He exhibits normal muscle tone. Coordination normal.  Skin: Skin is warm. Rash noted. There is erythema.  Multiple, erythematous papules to the most of the body.  No pustules, vesicles or edema.  Appears c/w insect bites.  Nursing note and vitals reviewed.   ED Course  Procedures (including critical care time)   MDM   Final diagnoses:  Multiple insect bites    Pt is well appearing multiple raised erythematous papules to most of the body that appear c/w insect bites.  Per pt hx, likely bed bugs.  Pt agrees to prednisone and vistaril for itching.  Advised to f/u with PMD if needed    Pauline Ausammy Kynnadi Dicenso, PA-C 05/25/15 2149  Dwayne HutchingBrian Cook, MD 05/28/15 [phone]

## 2015-11-12 DEATH — deceased

## 2016-07-16 IMAGING — CT CT HEAD W/O CM
4 of 5 series · 13 of 47 positions shown, 14 images · non-contrast
Comparison: 11/25/2012

CLINICAL DATA: Fell onto a rock earlier.

EXAM:
CT HEAD WITHOUT CONTRAST
CT CERVICAL SPINE WITHOUT CONTRAST
TECHNIQUE: Multidetector CT imaging of the head and cervical spine was
performed following the standard protocol without intravenous
contrast. Multiplanar CT image reconstructions of the cervical spine
were also generated.

[Series 2: headseq 4.8 h37s · axial · 0.47mm/px · z∈[+168,+255]mm · 3 of 36 slices shown, 4 images]
[im 9/36  brain]
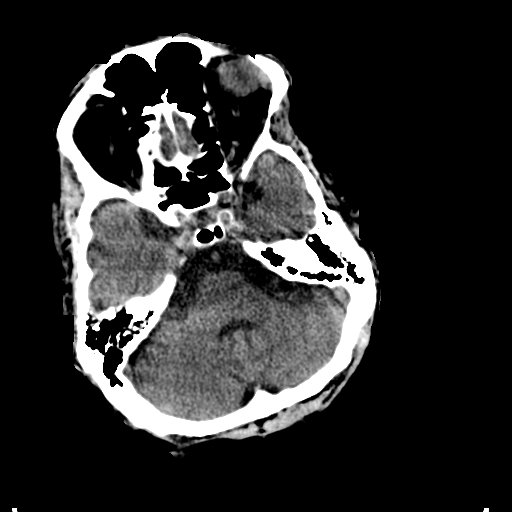
[im 9/36  bone]
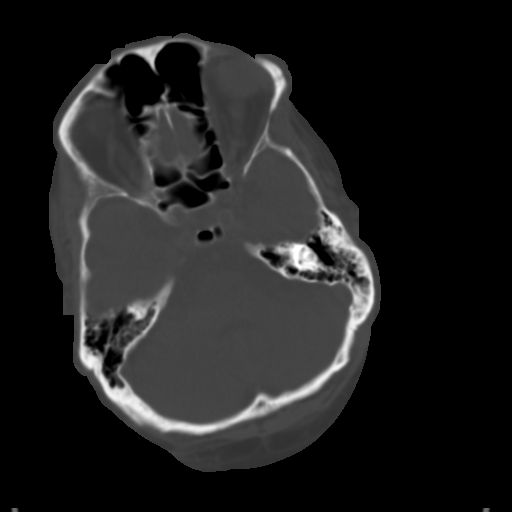
[im 18/36  brain]
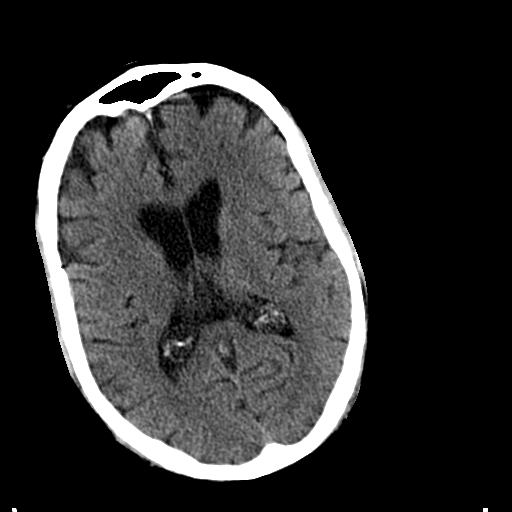
[im 27/36  brain]
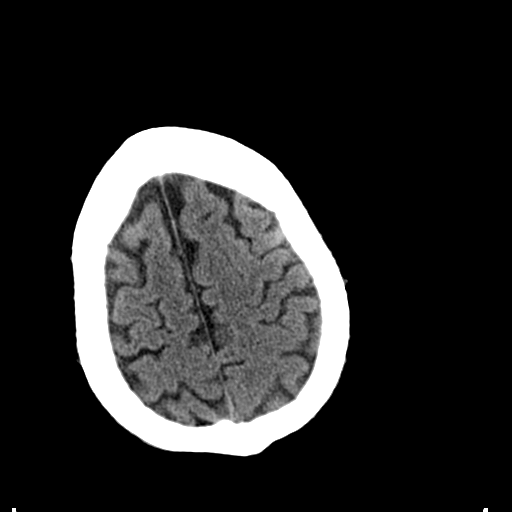

[Series 7: sagittal bone 2.0 · sagittal · 0.22mm/px · 3 of 60 slices shown]
[im 20/60  brain]
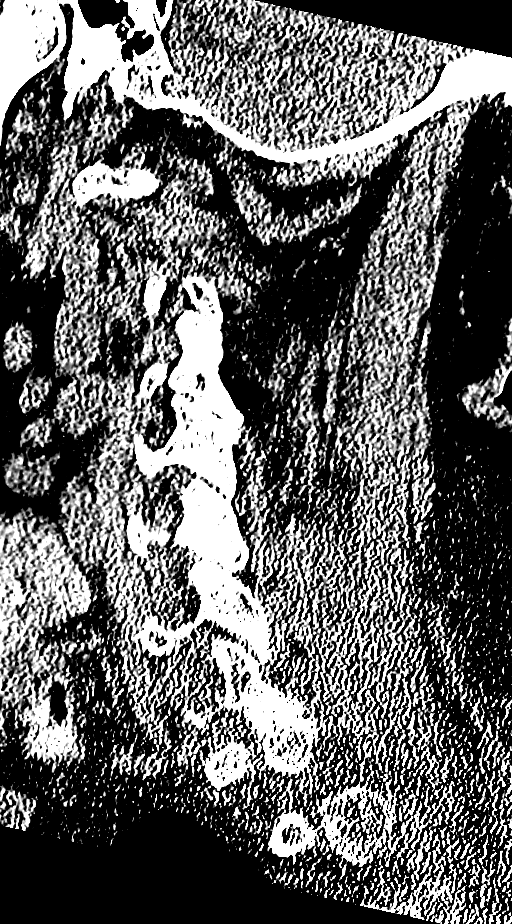
[im 30/60  brain]
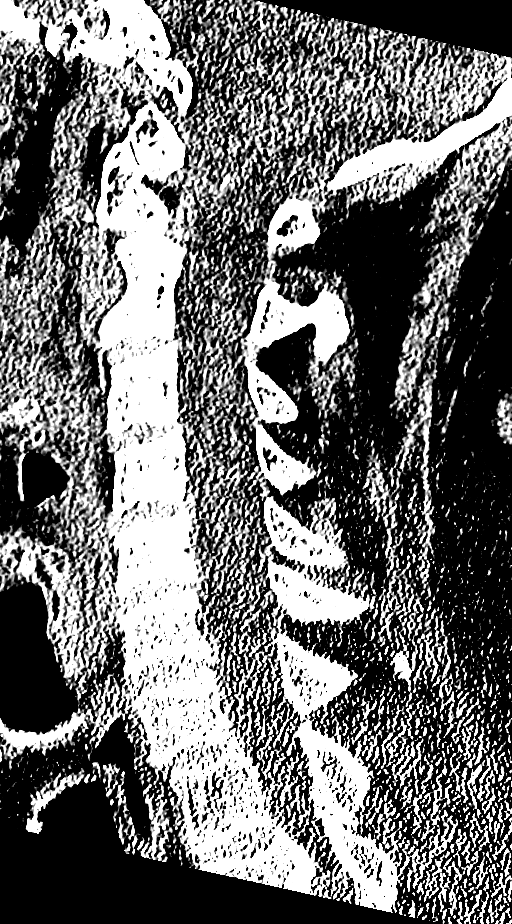
[im 40/60  brain]
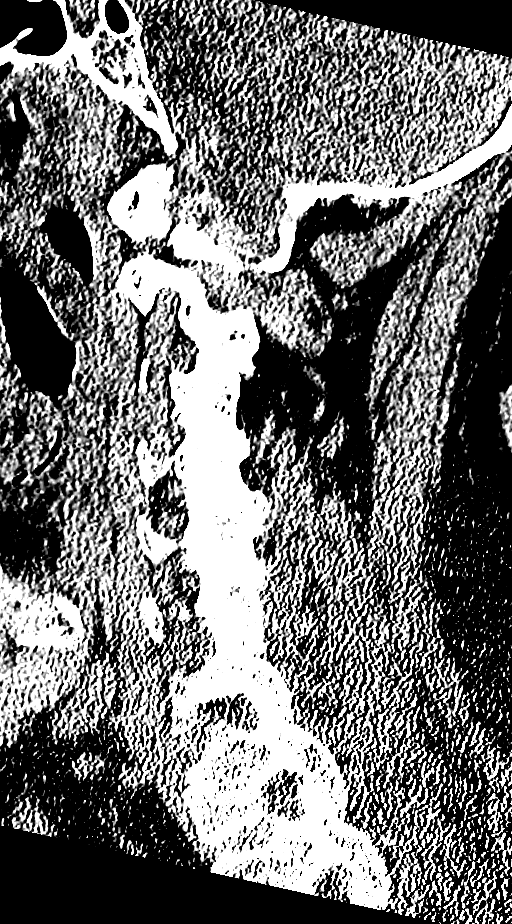

[Series 8: coronal bone 2.0 · coronal · 0.21mm/px · 3 of 54 slices shown]
[im 18/54  brain]
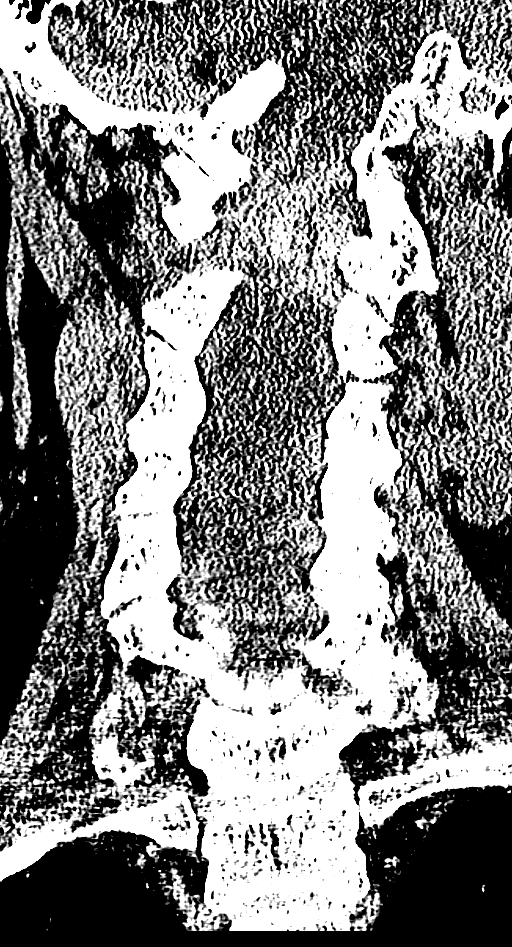
[im 24/54  brain]
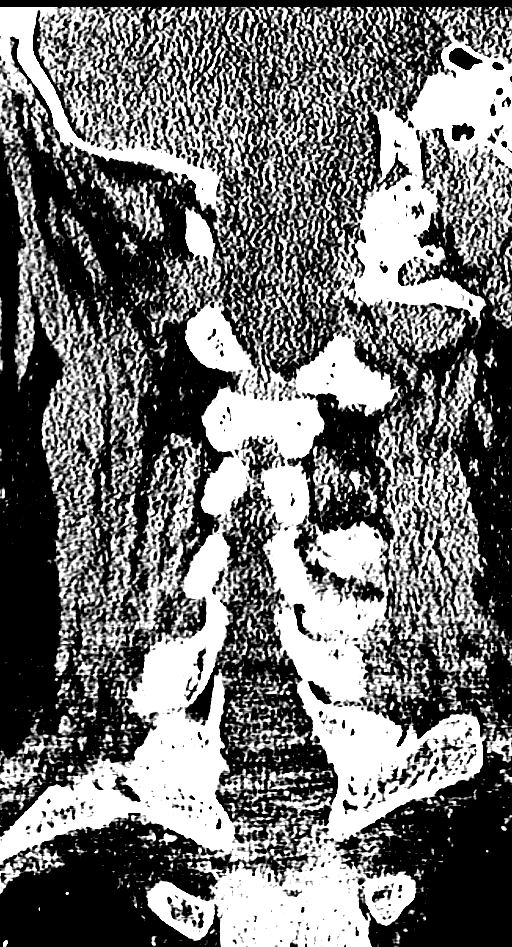
[im 30/54  brain]
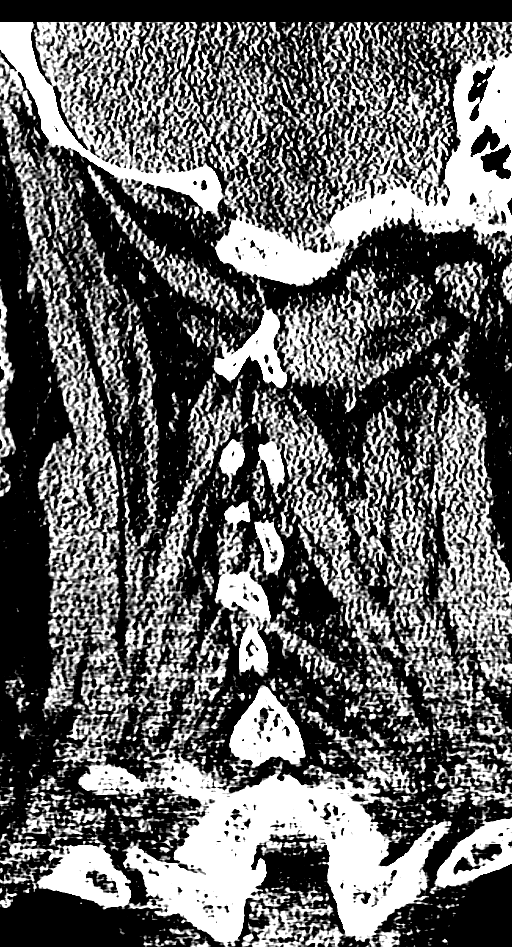

[Series 9: axial bone 2.0 · axial · 0.22mm/px · z∈[-53,+8]mm · 4 of 101 slices shown]
[im 9/101  bone]
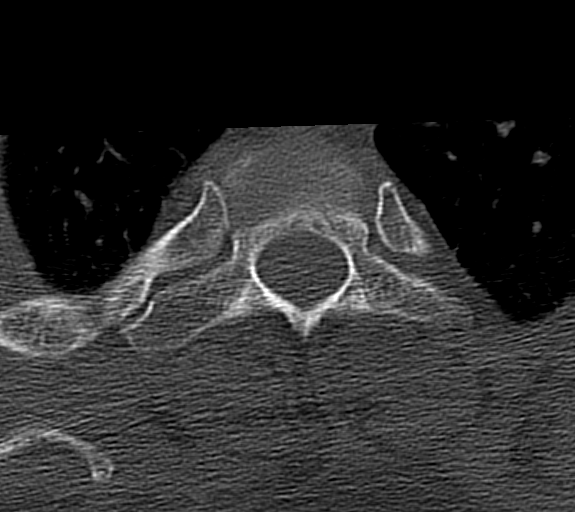
[im 26/101  bone]
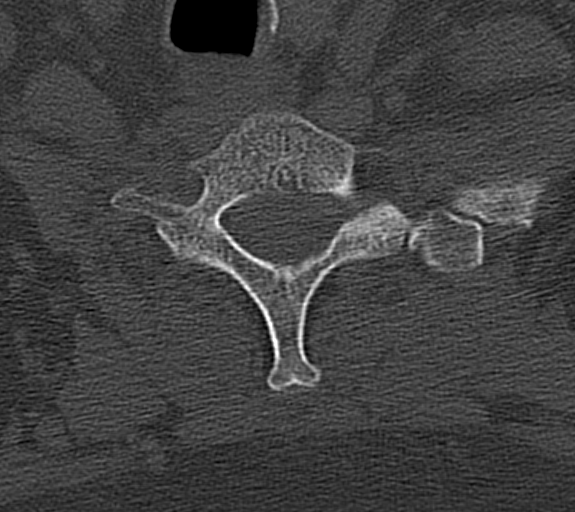
[im 34/101  bone]
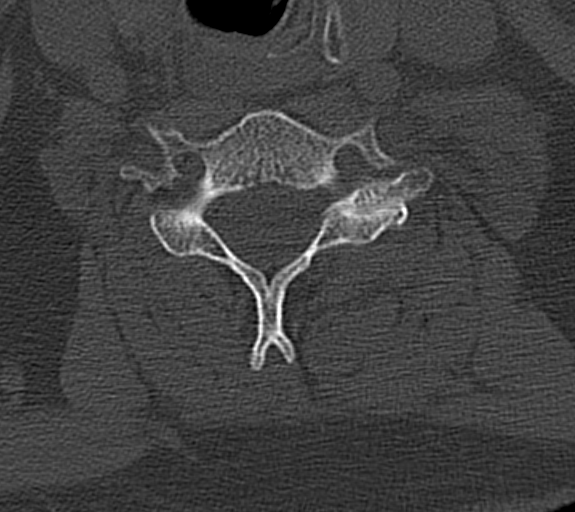
[im 42/101  bone]
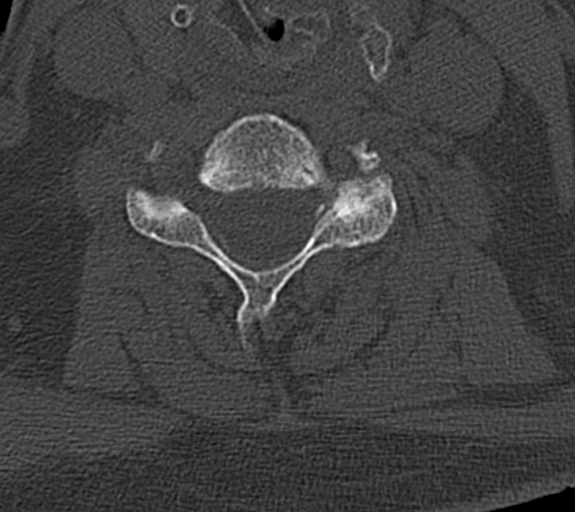

[13 of 47 positions shown; findings below may reference images not displayed]

FINDINGS: CT HEAD FINDINGS

There is no intracranial hemorrhage, mass or evidence of acute
infarction. There is mild generalized atrophy. There is mild chronic
microvascular ischemic change. There is no significant extra-axial
fluid collection.

No acute intracranial findings are evident. Calvarium and skullbase
are intact.

CT CERVICAL SPINE FINDINGS

The vertebral column, pedicles and facet articulations are intact.
There is no evidence of acute fracture. No acute soft tissue
abnormalities are evident.

No significant arthritic changes are evident.
IMPRESSION: 1. Negative for acute intracranial traumatic injury. There is
generalized atrophy and mild chronic microvascular ischemic change.
2. Negative for acute cervical spine fracture.
